# Patient Record
Sex: Male | Born: 1935 | Race: White | Hispanic: No | Marital: Married | Smoking: Never smoker
Health system: Southern US, Community
[De-identification: ages and names within clinical notes are randomized; demographics above are authoritative.]

## PROBLEM LIST (undated history)

## (undated) DIAGNOSIS — H409 Unspecified glaucoma: Secondary | ICD-10-CM

## (undated) DIAGNOSIS — I509 Heart failure, unspecified: Secondary | ICD-10-CM

## (undated) DIAGNOSIS — C4490 Unspecified malignant neoplasm of skin, unspecified: Secondary | ICD-10-CM

## (undated) DIAGNOSIS — M1990 Unspecified osteoarthritis, unspecified site: Secondary | ICD-10-CM

## (undated) DIAGNOSIS — Z5189 Encounter for other specified aftercare: Secondary | ICD-10-CM

## (undated) DIAGNOSIS — Z923 Personal history of irradiation: Secondary | ICD-10-CM

## (undated) DIAGNOSIS — I10 Essential (primary) hypertension: Secondary | ICD-10-CM

## (undated) DIAGNOSIS — C801 Malignant (primary) neoplasm, unspecified: Secondary | ICD-10-CM

## (undated) DIAGNOSIS — M109 Gout, unspecified: Secondary | ICD-10-CM

## (undated) DIAGNOSIS — J449 Chronic obstructive pulmonary disease, unspecified: Secondary | ICD-10-CM

## (undated) DIAGNOSIS — I4891 Unspecified atrial fibrillation: Secondary | ICD-10-CM

## (undated) HISTORY — DX: Unspecified glaucoma: H40.9

## (undated) HISTORY — DX: Malignant (primary) neoplasm, unspecified: C80.1

## (undated) HISTORY — DX: Unspecified malignant neoplasm of skin, unspecified: C44.90

## (undated) HISTORY — DX: Personal history of irradiation: Z92.3

## (undated) HISTORY — DX: Unspecified osteoarthritis, unspecified site: M19.90

## (undated) HISTORY — DX: Essential (primary) hypertension: I10

## (undated) HISTORY — DX: Encounter for other specified aftercare: Z51.89

## (undated) HISTORY — DX: Heart failure, unspecified: I50.9

## (undated) HISTORY — DX: Gout, unspecified: M10.9

---

## 1968-12-12 DIAGNOSIS — I10 Essential (primary) hypertension: Secondary | ICD-10-CM

## 1968-12-12 HISTORY — DX: Essential (primary) hypertension: I10

## 1993-12-12 DIAGNOSIS — C801 Malignant (primary) neoplasm, unspecified: Secondary | ICD-10-CM

## 1993-12-12 DIAGNOSIS — Z923 Personal history of irradiation: Secondary | ICD-10-CM

## 1993-12-12 HISTORY — DX: Malignant (primary) neoplasm, unspecified: C80.1

## 1993-12-12 HISTORY — DX: Personal history of irradiation: Z92.3

## 2000-12-12 HISTORY — PX: PROSTATE SURGERY: SHX751

## 2004-12-12 HISTORY — PX: COLONOSCOPY: SHX174

## 2005-04-25 ENCOUNTER — Ambulatory Visit: Payer: Self-pay | Admitting: Internal Medicine

## 2006-05-03 ENCOUNTER — Ambulatory Visit: Payer: Self-pay | Admitting: Specialist

## 2007-07-25 ENCOUNTER — Ambulatory Visit: Payer: Self-pay | Admitting: Ophthalmology

## 2007-07-26 ENCOUNTER — Ambulatory Visit: Payer: Self-pay | Admitting: Ophthalmology

## 2007-08-07 ENCOUNTER — Ambulatory Visit: Payer: Self-pay | Admitting: Family Medicine

## 2008-11-03 ENCOUNTER — Ambulatory Visit: Payer: Self-pay | Admitting: Family Medicine

## 2008-11-11 ENCOUNTER — Ambulatory Visit: Payer: Self-pay | Admitting: Family Medicine

## 2008-11-20 ENCOUNTER — Ambulatory Visit: Payer: Self-pay | Admitting: Family Medicine

## 2009-07-30 ENCOUNTER — Ambulatory Visit: Payer: Self-pay | Admitting: Family Medicine

## 2009-09-18 ENCOUNTER — Encounter: Admission: RE | Admit: 2009-09-18 | Discharge: 2009-09-18 | Payer: Self-pay | Admitting: Neurology

## 2010-03-11 ENCOUNTER — Ambulatory Visit: Payer: Self-pay | Admitting: Family Medicine

## 2010-12-12 DIAGNOSIS — M1990 Unspecified osteoarthritis, unspecified site: Secondary | ICD-10-CM

## 2010-12-12 HISTORY — DX: Unspecified osteoarthritis, unspecified site: M19.90

## 2010-12-21 ENCOUNTER — Ambulatory Visit: Payer: Self-pay | Admitting: Family Medicine

## 2011-03-09 ENCOUNTER — Ambulatory Visit: Payer: Self-pay | Admitting: Family Medicine

## 2011-03-12 ENCOUNTER — Ambulatory Visit: Payer: Self-pay | Admitting: Family Medicine

## 2011-03-14 ENCOUNTER — Ambulatory Visit: Payer: Self-pay | Admitting: Family Medicine

## 2011-06-04 ENCOUNTER — Observation Stay: Payer: Self-pay | Admitting: Specialist

## 2011-12-20 ENCOUNTER — Ambulatory Visit: Payer: Self-pay | Admitting: Family Medicine

## 2011-12-26 ENCOUNTER — Ambulatory Visit: Payer: Self-pay | Admitting: General Surgery

## 2011-12-26 ENCOUNTER — Ambulatory Visit: Payer: Self-pay | Admitting: Family Medicine

## 2011-12-28 ENCOUNTER — Ambulatory Visit: Payer: Self-pay | Admitting: General Surgery

## 2012-02-27 ENCOUNTER — Ambulatory Visit: Payer: Self-pay | Admitting: Family Medicine

## 2012-07-16 ENCOUNTER — Ambulatory Visit: Payer: Self-pay | Admitting: General Surgery

## 2012-09-04 ENCOUNTER — Ambulatory Visit: Payer: Self-pay | Admitting: Family Medicine

## 2012-12-03 ENCOUNTER — Ambulatory Visit: Payer: Self-pay | Admitting: Ophthalmology

## 2012-12-12 HISTORY — PX: CARDIAC DEFIBRILLATOR PLACEMENT: SHX171

## 2012-12-17 ENCOUNTER — Ambulatory Visit: Payer: Self-pay | Admitting: Family Medicine

## 2013-01-16 ENCOUNTER — Ambulatory Visit: Payer: Self-pay | Admitting: General Surgery

## 2013-05-30 ENCOUNTER — Ambulatory Visit: Payer: Self-pay | Admitting: Cardiology

## 2013-05-30 LAB — CBC WITH DIFFERENTIAL/PLATELET
Basophil #: 0 10*3/uL (ref 0.0–0.1)
Basophil %: 0.8 %
Eosinophil #: 0.5 10*3/uL (ref 0.0–0.7)
HCT: 39.7 % — ABNORMAL LOW (ref 40.0–52.0)
HGB: 13.8 g/dL (ref 13.0–18.0)
MCH: 34.1 pg — ABNORMAL HIGH (ref 26.0–34.0)
MCV: 98 fL (ref 80–100)
Monocyte #: 0.6 x10 3/mm (ref 0.2–1.0)
Monocyte %: 9.1 %
Neutrophil #: 3.7 10*3/uL (ref 1.4–6.5)
RDW: 14 % (ref 11.5–14.5)
WBC: 6.4 10*3/uL (ref 3.8–10.6)

## 2013-05-30 LAB — BASIC METABOLIC PANEL
Anion Gap: 4 — ABNORMAL LOW (ref 7–16)
Calcium, Total: 9.1 mg/dL (ref 8.5–10.1)
Chloride: 106 mmol/L (ref 98–107)
EGFR (African American): 59 — ABNORMAL LOW
Glucose: 70 mg/dL (ref 65–99)
Osmolality: 283 (ref 275–301)

## 2013-05-30 LAB — URINALYSIS, COMPLETE
Blood: NEGATIVE
Leukocyte Esterase: NEGATIVE
Nitrite: NEGATIVE
Ph: 5 (ref 4.5–8.0)
RBC,UR: <1 /HPF (ref 0–5)

## 2013-06-07 ENCOUNTER — Observation Stay: Payer: Self-pay | Admitting: Cardiology

## 2013-06-07 LAB — PROTIME-INR
INR: 1.1
Prothrombin Time: 14.5 secs (ref 11.5–14.7)

## 2013-06-08 LAB — BASIC METABOLIC PANEL
Anion Gap: 7 (ref 7–16)
BUN: 23 mg/dL — ABNORMAL HIGH (ref 7–18)
Calcium, Total: 8.8 mg/dL (ref 8.5–10.1)
Chloride: 107 mmol/L (ref 98–107)
Co2: 28 mmol/L (ref 21–32)
Creatinine: 1.38 mg/dL — ABNORMAL HIGH (ref 0.60–1.30)
EGFR (African American): 53 — ABNORMAL LOW
EGFR (Non-African Amer.): 46 — ABNORMAL LOW

## 2013-06-11 ENCOUNTER — Encounter: Payer: Self-pay | Admitting: *Deleted

## 2013-07-30 ENCOUNTER — Ambulatory Visit: Payer: Self-pay | Admitting: Family Medicine

## 2013-12-16 ENCOUNTER — Other Ambulatory Visit: Payer: Self-pay

## 2013-12-16 DIAGNOSIS — K8010 Calculus of gallbladder with chronic cholecystitis without obstruction: Secondary | ICD-10-CM

## 2014-01-17 ENCOUNTER — Ambulatory Visit: Payer: Self-pay | Admitting: General Surgery

## 2014-01-19 ENCOUNTER — Other Ambulatory Visit: Payer: Self-pay | Admitting: *Deleted

## 2014-01-19 DIAGNOSIS — K8010 Calculus of gallbladder with chronic cholecystitis without obstruction: Secondary | ICD-10-CM

## 2014-01-28 ENCOUNTER — Ambulatory Visit: Payer: Self-pay | Admitting: General Surgery

## 2014-02-19 ENCOUNTER — Ambulatory Visit (INDEPENDENT_AMBULATORY_CARE_PROVIDER_SITE_OTHER): Payer: Medicare HMO | Admitting: General Surgery

## 2014-02-19 ENCOUNTER — Encounter: Payer: Self-pay | Admitting: General Surgery

## 2014-02-19 VITALS — BP 104/64 | HR 58 | Resp 12 | Ht 71.5 in | Wt 195.0 lb

## 2014-02-19 DIAGNOSIS — K863 Pseudocyst of pancreas: Secondary | ICD-10-CM

## 2014-02-19 DIAGNOSIS — K8010 Calculus of gallbladder with chronic cholecystitis without obstruction: Secondary | ICD-10-CM

## 2014-02-19 DIAGNOSIS — R9389 Abnormal findings on diagnostic imaging of other specified body structures: Secondary | ICD-10-CM

## 2014-02-19 DIAGNOSIS — K862 Cyst of pancreas: Secondary | ICD-10-CM

## 2014-02-19 NOTE — Patient Instructions (Addendum)
Patient to return as needed. The patient is aware to call back for any questions or concerns. 

## 2014-02-19 NOTE — Progress Notes (Signed)
Patient ID: York Cerise, male   DOB: 02/21/1924, 80 y.o.   MRN: [id number]  Chief Complaint  Patient presents with  . Follow-up    gallbladder/pancrease     HPI ISLEY ZINNI is a 80 y.o. male here today for his one year follow up abdominal ultrasound which was done on 01/17/14. Patient states he is doing well.  The patient's wife died about 9 months ago. Although he is doing fairly well. He is now cooking for himself, and his weight is up 12 pounds over the past year.  Since his last visit he had a defibrillator/pacemaker placed.  A small pancreatic lesion had been identified incidentally on abdominal ultrasound, and this is a planned followup. He has been asymptomatic in regards to his known gallstones, at least one measuring just under 3 cm in diameter. As evidenced with his weight gain, he is not experiencing any dietary intolerance.  HPI  Past Medical History  Diagnosis Date  . Blood transfusion without reported diagnosis   . Congestive heart failure, unspecified   . Glaucoma   . Gout   . Hypertension 1970  . Arthritis 2012  . History of radiation therapy 1995    for prostate cancer  . Cancer 1995    prostate  . Skin cancer     Past Surgical History  Procedure Laterality Date  . Prostate surgery  2002  . Colonoscopy  2006  . Cardiac defibrillator placement  2014    History reviewed. No pertinent family history.  Social History History  Substance Use Topics  . Smoking status: Never Smoker   . Smokeless tobacco: Never Used  . Alcohol Use: Yes    Allergies  Allergen Reactions  . Codeine Nausea Only  . Penicillins Swelling    Current Outpatient Prescriptions  Medication Sig Dispense Refill  . apixaban (ELIQUIS) 5 MG TABS tablet Take 5 mg by mouth 2 (two) times daily.      . brimonidine (ALPHAGAN) 0.2 % ophthalmic solution Place 1 drop into both eyes 2 (two) times daily.      . budesonide-formoterol (SYMBICORT) 80-4.5 MCG/ACT inhaler Inhale 2 puffs  into the lungs 2 (two) times daily.      . Calcium Carbonate-Vitamin D (CALCIUM + D PO) Take 1 tablet by mouth daily.      Marland Kitchen doxazosin (CARDURA) 8 MG tablet Take 8 mg by mouth daily.      . furosemide (LASIX) 40 MG tablet Take 40 mg by mouth 2 (two) times daily.      Marland Kitchen lisinopril (PRINIVIL,ZESTRIL) 5 MG tablet Take 5 mg by mouth daily.      . metoprolol succinate (TOPROL-XL) 50 MG 24 hr tablet Take 50 mg by mouth 2 (two) times daily. Take with or immediately following a meal.      . Multiple Vitamin (MULTIVITAMIN) tablet Take 1 tablet by mouth daily.      . potassium chloride (MICRO-K) 10 MEQ CR capsule Take 10 mEq by mouth daily.      . simvastatin (ZOCOR) 40 MG tablet Take 40 mg by mouth daily.      . timolol (TIMOPTIC) 0.5 % ophthalmic solution Place 1 drop into both eyes 2 (two) times daily.       No current facility-administered medications for this visit.    Review of Systems Review of Systems  Constitutional: Negative.   Respiratory: Negative.   Cardiovascular: Negative.   Gastrointestinal: Negative.     Blood pressure 104/64, pulse 58, resp. rate 12,  height 5' 11.5" (1.816 m), weight 195 lb (88.451 kg).  Physical Exam Physical Exam  Constitutional: He is oriented to person, place, and time. He appears well-developed and well-nourished.  Neck: Neck supple. No thyromegaly present.  Cardiovascular: Normal rate, regular rhythm and normal heart sounds.   No murmur heard. Trace pitting edema.  Pulmonary/Chest: Effort normal and breath sounds normal.  Abdominal: Soft. Normal appearance and bowel sounds are normal. There is no hepatosplenomegaly. There is no tenderness. No hernia.  Lymphadenopathy:    He has no cervical adenopathy.  Neurological: He is alert and oriented to person, place, and time.  Skin: Skin is warm and dry.    Data Reviewed Abdominal ultrasound dated January 17, 2014 again noted gallstones measuring up to 2.9 cm in diameter. No evidence of cholecystitis.  The pancreas was obscured by gas. No focal abnormalities reported. In the left kidney a 2.5 cm cyst is identified and adjacent to this is a complex lesion measuring up to 3.3 cm. This was felt to correlate with the partially calcified lesion on past CT scans. With the enlargement in the left renal lesion from 2013-2015 additional imaging studies were recommended.   Assessment    Asymptomatic gallstones.  Previously identified small pancreatic lesion.  Possible enlarging left renal mass.     Plan    Indications for a triphasic CT to evaluate the left renal area as well as to better assess the pancreas (poorly visualized in both 2014 in 2015) was recommended. The patient declines. In part this may be due to the loss of his wife in the last year, or maybe his realization that with his advanced age intervention might be complicated. I did emphasize to him that a renal lesion might be managed by something as simple as a percutaneous process. He again at this time declines further diagnostic studies.  At this time we'll release him for routine followup. He was encouraged to call if he has any concerns or desires to readdress the additional imaging studies discussed above.         Robert Bellow 02/22/2014, 8:29 PM  Miguel Aschoff, M.D.

## 2014-02-22 DIAGNOSIS — K8010 Calculus of gallbladder with chronic cholecystitis without obstruction: Secondary | ICD-10-CM | POA: Insufficient documentation

## 2014-02-22 DIAGNOSIS — R9389 Abnormal findings on diagnostic imaging of other specified body structures: Secondary | ICD-10-CM | POA: Insufficient documentation

## 2014-02-22 DIAGNOSIS — K862 Cyst of pancreas: Secondary | ICD-10-CM | POA: Insufficient documentation

## 2014-04-03 ENCOUNTER — Ambulatory Visit: Payer: Self-pay | Admitting: Family Medicine

## 2014-04-21 ENCOUNTER — Ambulatory Visit: Payer: Self-pay | Admitting: Family Medicine

## 2014-04-29 ENCOUNTER — Ambulatory Visit: Payer: Self-pay | Admitting: Family Medicine

## 2014-06-07 ENCOUNTER — Ambulatory Visit: Payer: Self-pay | Admitting: Family Medicine

## 2014-06-07 LAB — COMPREHENSIVE METABOLIC PANEL
ANION GAP: 9 (ref 7–16)
AST: 39 U/L — AB (ref 15–37)
Albumin: 3.1 g/dL — ABNORMAL LOW (ref 3.4–5.0)
Alkaline Phosphatase: 178 U/L — ABNORMAL HIGH
BILIRUBIN TOTAL: 0.9 mg/dL (ref 0.2–1.0)
BUN: 28 mg/dL — ABNORMAL HIGH (ref 7–18)
CALCIUM: 9.2 mg/dL (ref 8.5–10.1)
CREATININE: 2.28 mg/dL — AB (ref 0.60–1.30)
Chloride: 105 mmol/L (ref 98–107)
Co2: 26 mmol/L (ref 21–32)
EGFR (African American): 29 — ABNORMAL LOW
EGFR (Non-African Amer.): 25 — ABNORMAL LOW
GLUCOSE: 109 mg/dL — AB (ref 65–99)
Osmolality: 285 (ref 275–301)
POTASSIUM: 3.9 mmol/L (ref 3.5–5.1)
SGPT (ALT): 26 U/L (ref 12–78)
Sodium: 140 mmol/L (ref 136–145)
TOTAL PROTEIN: 7.3 g/dL (ref 6.4–8.2)

## 2014-06-07 LAB — CBC WITH DIFFERENTIAL/PLATELET
BASOS ABS: 0.1 10*3/uL (ref 0.0–0.1)
BASOS PCT: 0.5 %
Eosinophil #: 0.1 10*3/uL (ref 0.0–0.7)
Eosinophil %: 1.1 %
HCT: 37.9 % — ABNORMAL LOW (ref 40.0–52.0)
HGB: 12.4 g/dL — ABNORMAL LOW (ref 13.0–18.0)
LYMPHS ABS: 1.3 10*3/uL (ref 1.0–3.6)
LYMPHS PCT: 11.6 %
MCH: 32.7 pg (ref 26.0–34.0)
MCHC: 32.7 g/dL (ref 32.0–36.0)
MCV: 100 fL (ref 80–100)
MONOS PCT: 7 %
Monocyte #: 0.8 x10 3/mm (ref 0.2–1.0)
NEUTROS ABS: 8.9 10*3/uL — AB (ref 1.4–6.5)
Neutrophil %: 79.8 %
Platelet: 192 10*3/uL (ref 150–440)
RBC: 3.79 10*6/uL — ABNORMAL LOW (ref 4.40–5.90)
RDW: 13.6 % (ref 11.5–14.5)
WBC: 11.2 10*3/uL — ABNORMAL HIGH (ref 3.8–10.6)

## 2014-06-25 ENCOUNTER — Emergency Department: Payer: Self-pay | Admitting: Emergency Medicine

## 2014-06-25 LAB — BASIC METABOLIC PANEL
Anion Gap: 6 — ABNORMAL LOW (ref 7–16)
BUN: 21 mg/dL — ABNORMAL HIGH (ref 7–18)
CHLORIDE: 104 mmol/L (ref 98–107)
Calcium, Total: 8.8 mg/dL (ref 8.5–10.1)
Co2: 30 mmol/L (ref 21–32)
Creatinine: 1.45 mg/dL — ABNORMAL HIGH (ref 0.60–1.30)
EGFR (African American): 49 — ABNORMAL LOW
EGFR (Non-African Amer.): 43 — ABNORMAL LOW
GLUCOSE: 132 mg/dL — AB (ref 65–99)
Osmolality: 284 (ref 275–301)
POTASSIUM: 4 mmol/L (ref 3.5–5.1)
Sodium: 140 mmol/L (ref 136–145)

## 2014-06-25 LAB — CBC
HCT: 34.8 % — AB (ref 40.0–52.0)
HGB: 11.4 g/dL — ABNORMAL LOW (ref 13.0–18.0)
MCH: 32.9 pg (ref 26.0–34.0)
MCHC: 32.9 g/dL (ref 32.0–36.0)
MCV: 100 fL (ref 80–100)
PLATELETS: 152 10*3/uL (ref 150–440)
RBC: 3.48 10*6/uL — ABNORMAL LOW (ref 4.40–5.90)
RDW: 13.8 % (ref 11.5–14.5)
WBC: 8.7 10*3/uL (ref 3.8–10.6)

## 2014-06-25 LAB — TROPONIN I: Troponin-I: <0.02 ng/mL

## 2014-06-25 LAB — D-DIMER(ARMC): D-Dimer: 1134 ng/ml

## 2014-07-30 ENCOUNTER — Ambulatory Visit: Payer: Self-pay | Admitting: Family Medicine

## 2014-09-16 ENCOUNTER — Encounter: Payer: Self-pay | Admitting: Family Medicine

## 2014-09-18 ENCOUNTER — Inpatient Hospital Stay: Payer: Self-pay | Admitting: Internal Medicine

## 2014-09-18 LAB — COMPREHENSIVE METABOLIC PANEL
ALK PHOS: 124 U/L — AB
Albumin: 2.7 g/dL — ABNORMAL LOW (ref 3.4–5.0)
Anion Gap: 9 (ref 7–16)
BILIRUBIN TOTAL: 2 mg/dL — AB (ref 0.2–1.0)
BUN: 30 mg/dL — AB (ref 7–18)
CO2: 24 mmol/L (ref 21–32)
CREATININE: 2.14 mg/dL — AB (ref 0.60–1.30)
Calcium, Total: 8.5 mg/dL (ref 8.5–10.1)
Chloride: 102 mmol/L (ref 98–107)
EGFR (African American): 38 — ABNORMAL LOW
EGFR (Non-African Amer.): 31 — ABNORMAL LOW
GLUCOSE: 132 mg/dL — AB (ref 65–99)
Osmolality: 278 (ref 275–301)
Potassium: 3.9 mmol/L (ref 3.5–5.1)
SGOT(AST): 30 U/L (ref 15–37)
SGPT (ALT): 21 U/L
SODIUM: 135 mmol/L — AB (ref 136–145)
Total Protein: 6.8 g/dL (ref 6.4–8.2)

## 2014-09-18 LAB — CK TOTAL AND CKMB (NOT AT ARMC)
CK, Total: 97 U/L
CK-MB: 1.3 ng/mL (ref 0.5–3.6)

## 2014-09-18 LAB — CBC
HCT: 32.8 % — ABNORMAL LOW (ref 40.0–52.0)
HGB: 11 g/dL — ABNORMAL LOW (ref 13.0–18.0)
MCH: 32.9 pg (ref 26.0–34.0)
MCHC: 33.4 g/dL (ref 32.0–36.0)
MCV: 98 fL (ref 80–100)
PLATELETS: 212 10*3/uL (ref 150–440)
RBC: 3.33 10*6/uL — ABNORMAL LOW (ref 4.40–5.90)
RDW: 13.9 % (ref 11.5–14.5)
WBC: 16 10*3/uL — AB (ref 3.8–10.6)

## 2014-09-18 LAB — TROPONIN I
TROPONIN-I: 0.04 ng/mL
Troponin-I: 0.04 ng/mL

## 2014-09-18 LAB — PROTIME-INR
INR: 1.9
PROTHROMBIN TIME: 21.3 s — AB (ref 11.5–14.7)

## 2014-09-18 LAB — BILIRUBIN, DIRECT: Bilirubin, Direct: 1 mg/dL — ABNORMAL HIGH (ref 0.00–0.20)

## 2014-09-18 LAB — CK-MB: CK-MB: 1.3 ng/mL (ref 0.5–3.6)

## 2014-09-18 LAB — PRO B NATRIURETIC PEPTIDE: B-Type Natriuretic Peptide: 31480 pg/mL — ABNORMAL HIGH (ref 0–450)

## 2014-09-19 LAB — COMPREHENSIVE METABOLIC PANEL
ALT: 22 U/L
ANION GAP: 11 (ref 7–16)
AST: 33 U/L (ref 15–37)
Albumin: 2.3 g/dL — ABNORMAL LOW (ref 3.4–5.0)
Alkaline Phosphatase: 117 U/L — ABNORMAL HIGH
BILIRUBIN TOTAL: 1.6 mg/dL — AB (ref 0.2–1.0)
BUN: 39 mg/dL — AB (ref 7–18)
CO2: 23 mmol/L (ref 21–32)
Calcium, Total: 8.4 mg/dL — ABNORMAL LOW (ref 8.5–10.1)
Chloride: 103 mmol/L (ref 98–107)
Creatinine: 2.27 mg/dL — ABNORMAL HIGH (ref 0.60–1.30)
EGFR (African American): 35 — ABNORMAL LOW
GFR CALC NON AF AMER: 29 — AB
Glucose: 165 mg/dL — ABNORMAL HIGH (ref 65–99)
Osmolality: 287 (ref 275–301)
Potassium: 4.1 mmol/L (ref 3.5–5.1)
Sodium: 137 mmol/L (ref 136–145)
TOTAL PROTEIN: 6.1 g/dL — AB (ref 6.4–8.2)

## 2014-09-19 LAB — CBC WITH DIFFERENTIAL/PLATELET
BASOS PCT: 0 %
Basophil #: 0 10*3/uL (ref 0.0–0.1)
EOS PCT: 0 %
Eosinophil #: 0 10*3/uL (ref 0.0–0.7)
HCT: 31.1 % — ABNORMAL LOW (ref 40.0–52.0)
HGB: 10.4 g/dL — AB (ref 13.0–18.0)
Lymphocyte #: 0.5 10*3/uL — ABNORMAL LOW (ref 1.0–3.6)
Lymphocyte %: 4.1 %
MCH: 32.7 pg (ref 26.0–34.0)
MCHC: 33.4 g/dL (ref 32.0–36.0)
MCV: 98 fL (ref 80–100)
Monocyte #: 0.5 x10 3/mm (ref 0.2–1.0)
Monocyte %: 4.1 %
Neutrophil #: 10.9 10*3/uL — ABNORMAL HIGH (ref 1.4–6.5)
Neutrophil %: 91.8 %
PLATELETS: 161 10*3/uL (ref 150–440)
RBC: 3.18 10*6/uL — AB (ref 4.40–5.90)
RDW: 14.1 % (ref 11.5–14.5)
WBC: 11.8 10*3/uL — ABNORMAL HIGH (ref 3.8–10.6)

## 2014-09-19 LAB — TROPONIN I: Troponin-I: <0.02 ng/mL

## 2014-09-19 LAB — CK-MB
CK-MB: 1.8 ng/mL (ref 0.5–3.6)
CK-MB: 2.1 ng/mL (ref 0.5–3.6)

## 2014-09-20 LAB — CBC WITH DIFFERENTIAL/PLATELET
Basophil #: 0 10*3/uL (ref 0.0–0.1)
Basophil %: 0 %
Eosinophil #: 0 10*3/uL (ref 0.0–0.7)
Eosinophil %: 0 %
HCT: 30.3 % — ABNORMAL LOW (ref 40.0–52.0)
HGB: 10.1 g/dL — ABNORMAL LOW (ref 13.0–18.0)
LYMPHS PCT: 2.8 %
Lymphocyte #: 0.4 10*3/uL — ABNORMAL LOW (ref 1.0–3.6)
MCH: 32.2 pg (ref 26.0–34.0)
MCHC: 33.4 g/dL (ref 32.0–36.0)
MCV: 96 fL (ref 80–100)
Monocyte #: 0.6 x10 3/mm (ref 0.2–1.0)
Monocyte %: 4 %
NEUTROS PCT: 93.2 %
Neutrophil #: 13.8 10*3/uL — ABNORMAL HIGH (ref 1.4–6.5)
Platelet: 209 10*3/uL (ref 150–440)
RBC: 3.14 10*6/uL — ABNORMAL LOW (ref 4.40–5.90)
RDW: 14.3 % (ref 11.5–14.5)
WBC: 14.8 10*3/uL — AB (ref 3.8–10.6)

## 2014-09-20 LAB — BASIC METABOLIC PANEL
ANION GAP: 13 (ref 7–16)
BUN: 59 mg/dL — ABNORMAL HIGH (ref 7–18)
CALCIUM: 8.3 mg/dL — AB (ref 8.5–10.1)
CO2: 23 mmol/L (ref 21–32)
Chloride: 103 mmol/L (ref 98–107)
Creatinine: 2.64 mg/dL — ABNORMAL HIGH (ref 0.60–1.30)
EGFR (African American): 30 — ABNORMAL LOW
GFR CALC NON AF AMER: 24 — AB
Glucose: 141 mg/dL — ABNORMAL HIGH (ref 65–99)
Osmolality: 296 (ref 275–301)
Potassium: 3.8 mmol/L (ref 3.5–5.1)
SODIUM: 139 mmol/L (ref 136–145)

## 2014-09-21 LAB — CBC WITH DIFFERENTIAL/PLATELET
BASOS ABS: 0 10*3/uL (ref 0.0–0.1)
Basophil %: 0.1 %
EOS PCT: 0 %
Eosinophil #: 0 10*3/uL (ref 0.0–0.7)
HCT: 30.2 % — ABNORMAL LOW (ref 40.0–52.0)
HGB: 10.2 g/dL — AB (ref 13.0–18.0)
Lymphocyte #: 0.5 10*3/uL — ABNORMAL LOW (ref 1.0–3.6)
Lymphocyte %: 3.5 %
MCH: 32.7 pg (ref 26.0–34.0)
MCHC: 33.8 g/dL (ref 32.0–36.0)
MCV: 97 fL (ref 80–100)
MONO ABS: 0.4 x10 3/mm (ref 0.2–1.0)
Monocyte %: 3.2 %
NEUTROS PCT: 93.2 %
Neutrophil #: 12.7 10*3/uL — ABNORMAL HIGH (ref 1.4–6.5)
Platelet: 251 10*3/uL (ref 150–440)
RBC: 3.12 10*6/uL — AB (ref 4.40–5.90)
RDW: 14.6 % — AB (ref 11.5–14.5)
WBC: 13.6 10*3/uL — ABNORMAL HIGH (ref 3.8–10.6)

## 2014-09-21 LAB — BASIC METABOLIC PANEL
ANION GAP: 10 (ref 7–16)
BUN: 82 mg/dL — AB (ref 7–18)
CHLORIDE: 102 mmol/L (ref 98–107)
CREATININE: 2.92 mg/dL — AB (ref 0.60–1.30)
Calcium, Total: 8.2 mg/dL — ABNORMAL LOW (ref 8.5–10.1)
Co2: 25 mmol/L (ref 21–32)
EGFR (African American): 26 — ABNORMAL LOW
GFR CALC NON AF AMER: 22 — AB
Glucose: 145 mg/dL — ABNORMAL HIGH (ref 65–99)
Osmolality: 301 (ref 275–301)
Potassium: 4.1 mmol/L (ref 3.5–5.1)
SODIUM: 137 mmol/L (ref 136–145)

## 2014-09-23 LAB — CULTURE, BLOOD (SINGLE)

## 2014-11-10 ENCOUNTER — Encounter: Payer: Self-pay | Admitting: Family Medicine

## 2014-11-11 ENCOUNTER — Encounter: Payer: Self-pay | Admitting: Family Medicine

## 2014-12-12 ENCOUNTER — Encounter: Payer: Self-pay | Admitting: Family Medicine

## 2015-01-28 ENCOUNTER — Encounter: Payer: Self-pay | Admitting: Family Medicine

## 2015-02-06 IMAGING — CR DG CHEST 1V PORT
1 series · 1 of 1 positions shown · non-contrast
Comparison: none

REASON FOR EXAM: Pacemaker
COMMENTS:

[ap]
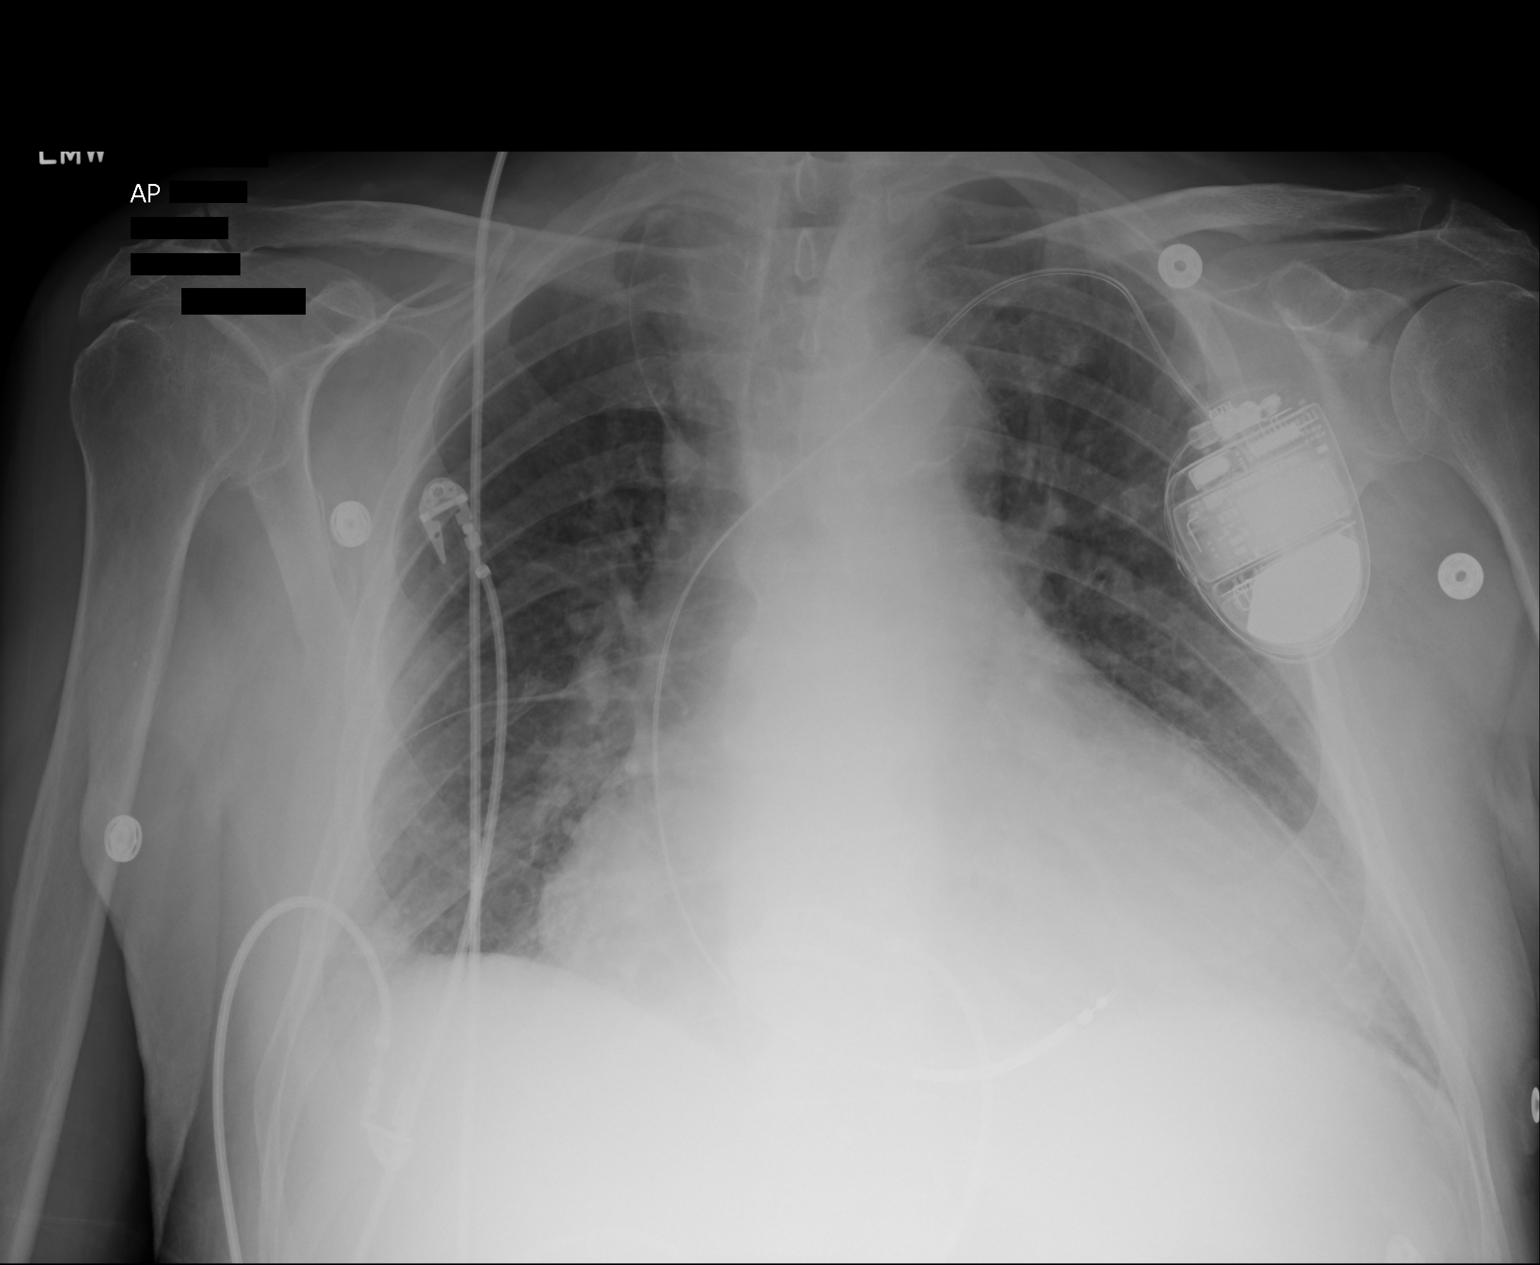

[1 of 1 positions shown; findings below may reference images not displayed]

PROCEDURE:     DXR - DXR PORTABLE CHEST SINGLE VIEW  - June 08, 2013  [DATE]

RESULT:     Comparison is made to the study May 30, 2013.

The cardiac silhouette remains enlarged. The pulmonary vascularity is less
distinct today. The interstitial markings are slightly more conspicuous. A
permanent pacemaker has been placed. There is no evidence of a pneumothorax
or pleural effusion. There is an azygos lobe type anatomy of the right lung.
IMPRESSION: The findings suggest low-grade CHF. A portion of this is
related to the portable technique. No post procedure complication is
demonstrated.

[REDACTED]

## 2015-02-10 ENCOUNTER — Encounter: Payer: Self-pay | Admitting: Family Medicine

## 2015-02-23 ENCOUNTER — Ambulatory Visit: Payer: Self-pay | Admitting: Family Medicine

## 2015-03-17 ENCOUNTER — Ambulatory Visit
Admit: 2015-03-17 | Disposition: A | Discharge status: Home / Self Care | Payer: Self-pay | Attending: Family Medicine | Admitting: Family Medicine

## 2015-04-03 NOTE — Op Note (Signed)
PATIENT NAME:  [name], [name] MR#:  [id number] DATE OF BIRTH:  September 04, 1924  DATE OF PROCEDURE:  06/07/2013  TITLE OF PROCEDURE: Implantation of a single chamber implantable cardioverter defibrillator.   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 Device is indicated for primary prevention of sudden cardiac arrest.   PREPROCEDURE DIAGNOSES:  1.  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   POSTPROCEDURE DIAGNOSES: 1.  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   DETAILS OF PROCEDURE: The patient was brought to the operating room in a fasting, nonsedated state. The patient was prepped and draped in the usual sterile manner. The area of the left infraclavicular fossa was scrubbed and prepped with ChloraPrep x 2. Local anesthesia was administered after the appropriate resuscitative equipment was attached to the patient. Informed consent was obtained and placed in the patient's permanent medical record. In the left infraclavicular fossa, a 2 cm incision was made, and with the combination of electrocautery and blunt dissection, the ICD pocket was fashioned in the usual manner. Access to the central circulation was done with fluoroscopic guidance and using the modified Seldinger technique. A guidewire was passed easily through the axillary vein to the area of the inferior vena cava. Over the guidewire, a dilator and sheath was placed. The dilator and guidewire were subsequently removed, and the RV lead was advanced to the right ventricular outflow tract. It was subsequently withdrawn to the mid interventricular septum. Sensed R waves there were 10.4 mV with a slew rate of 1.6 V/sec, impedance 635 ohms, and a threshold of 0.9 V at 0.5 ms. The lead was secured to the prepectoral fascia using an 0 silk suture and adequate redundancy was confirmed using fluoroscopic guidance. Total fluoroscopy time for the case was 5 minutes and 9  seconds.   The pocket was then irrigated with copious amounts of gentamicin solution, and adequate hemostasis was confirmed. The lead was then connected to the ICD generator and was placed in the pocket, and the pocket was closed with running layers of 2-0 and 3-0 Vicryl, and the subcuticular layer with 4-0 Vicryl.   No complications were noted. Minimal blood loss noted.   SUMMARY OF IMPLANTED HARDWARE: The patient received a Medtronic single-chamber ICD, model Evera S VR [id number], serial number [id number], implanted on 06/07/2013. The RV lead was a Medtronic [id number] Sprint Quattro, serial number [id number], implanted on 06/07/2013.   FINAL PROGRAM PARAMETERS: Mode VVI 40. The patient was in chronic atrial fibrillation. Output 3.5 V at 0.4 ms. VF zone was set to detect at rates greater than 200 beats per minute, 18 out of 24. The VT monitor zone was at 162 with detection at 32.     ____________________________ [name]. Marcello Moores, MD klt:dmm D: 06/07/2013 16:57:20 ET T: 06/07/2013 22:46:31 ET JOB#: [id number]  cc: Lennette Bihari L. Marcello Moores, MD, <Dictator> Marzetta Board MD ELECTRONICALLY SIGNED 06/16/2013 19:22

## 2015-04-04 NOTE — Consult Note (Signed)
Present Illness Patient is an 81 year old male with history of atrial fibrillation which is chronic.  He also has a cardiomyopathy with an AICD in place.  He was recently admitted with complaints of shortness of breath and weakness.  He was noted to have atrial fibrillation with a rapid ventricular response.  He was placed on an amiodarone drip with improvement in his heart rate.  Chest x-ray revealed evidence of pneumonia.  It is of note that the patient had an admission with similar complaints approximately 1-2 months ago.  He complained on this admission of weakness and fatigue.  He was noted to have a rapid heart rate similar to his previous admission.  He complains of shortness of breath.  His heart rate is improved with IV Cardizem.  His ejection fraction is 10-15%.  He is currently ruled out for a myocardial infarction.  As an outpatient he is being treated with apixaban 5 mg twice daily for anticoagulation.  His rate is controlled with metoprolol.  His serum creatinine has increased since his admission.  His GFR is approximately 29.  his Eliquis has been decreased to 0.5 mg twice daily. chest x-ray revealed moderate pulmonary edema as well as evidence of probable pneumonia.   Physical Exam:  GEN no acute distress   HEENT hearing intact to voice   NECK supple   RESP normal resp effort  rhonchi   CARD Irregular rate and rhythm  Tachycardic   ABD denies tenderness   LYMPH negative neck, negative axillae   EXTR negative cyanosis/clubbing   SKIN normal to palpation, No rashes   NEURO cranial nerves intact, motor/sensory function intact   PSYCH alert, poor insight   Review of Systems:  Subjective/Chief Complaint Weakness fatigue and shortness of breath   General: Fatigue  Weakness   Skin: No Complaints   ENT: No Complaints   Eyes: No Complaints   Neck: No Complaints   Respiratory: Short of breath   Cardiovascular: Tightness  Palpitations  Dyspnea   Gastrointestinal: No  Complaints   Genitourinary: No Complaints   Vascular: No Complaints   Musculoskeletal: No Complaints   Neurologic: No Complaints   Hematologic: No Complaints   Endocrine: No Complaints   Psychiatric: No Complaints   Review of Systems: All other systems were reviewed and found to be negative   Medications/Allergies Reviewed Medications/Allergies reviewed   Family & Social History:  Family and Social History:  Family History Non-Contributory   EKG:  Abnormal NSSTTW changes   Interpretation Atrial fibrillation with rapid ventricular response.  There were no obvious ischemic changes.    Penicillin: Swelling  Codeine: N/V/Diarrhea  Tetanus Toxoid: Unknown   Impression 81 year old male with history of chronic atrial fibrillation and dilated cardiomyopathy with ejection fraction of 10-15% with an AICD in place.  He was admitted with shortness of breath.  Chest x-ray suggested probable pneumonia as well as volume overload.  This renal function was also reduced somewhat with a GFR of 29.  This is down from the mid 108s.  He has had improvement in his rapid atrial fibrillation with IV amiodarone.  Will convert from IV amiodarone to p.o. amiodarone at 400 mg daily and follow up.  Will continue to carefully diurese following is renal function.  Would consider nephrology evaluation to assist with management of his worsening renal insufficiency.  Patient is not appear ischemic at present.  Would carefully diurese in rate control with his amiodarone p.o..  Will need to carefully follow renal function with his  apixaban.  Would agree would dosing at 2.5 mg twice daily for now.  If his GFR follows much more, will need to consider discontinuing apixaban and treating with warfarin for anticoagulation.   Plan 1. Will discontinue IV amiodarone and place on 400 mg of p.o. amiodarone daily 2. Carefully follow renal function while diuresing 3. Low-sodium diet 4. Daily weights 5. further  recommendations pending course   Electronic Signatures: Teodoro Spray (MD)  (Signed 09-Oct-15 13:23)  Authored: General Aspect/Present Illness, History and Physical Exam, Review of System, Family & Social History, EKG , Allergies, Impression/Plan   Last Updated: 09-Oct-15 13:23 by Teodoro Spray (MD)

## 2015-04-04 NOTE — H&P (Signed)
PATIENT NAME:  Philip Morris, Philip Morris MR#:  [id number] DATE OF BIRTH:  [birth year]  DATE OF ADMISSION:  09/18/2014  PRIMARY CARE PHYSICIAN:  Dr. Rosanna Randy.    CARDIOLOGIST:  Dr. Saralyn Pilar.    HISTORY OF PRESENT ILLNESS: The patient is an 81 year old Caucasian male with past medical history significant for history of chronic systolic CHF with idiopathic dilated cardiomyopathy with ejection fraction of 10-15%, who presents to the hospital with complaints of not feeling well. Apparently the patient has been weak and fatigued for the past few days. He has been also noticing chills and cough with no significant sputum production. He has been also complaining of left-sided chest discomfort, especially whenever he walks around or moves around. He presented to his primary care physician's office where he was noted to be hypotensive with systolic blood pressure of 88/50. His heart rate was in 90s at that point and he was transferred to the Emergency Room. In the Emergency Room his heart rate was found to be 150. His blood pressure was 798 systolic. He was given 5 mg of IV Lopressor, after which his blood pressure dropped down to around 110s and his heart rate dropped down to around 100. He admitted to feeling palpitations on arrival to the hospital and some discomfort in his left lower chest as mentioned above. He however has been complaining of shortness of breath and feeling very sleepy to me.  On further evaluation he was noted to have likely left lower lobe pneumonia, also acute on chronic renal failure with creatinine level of 2.14 on admission, his baseline creatinine is around 1.15 June 2014.   PAST MEDICAL HISTORY: Significant for history of chronic systolic CHF, ejection fraction of 10-15% due to idiopathic dilated cardiomyopathy, some valvular disease, history of hypertension, hyperlipidemia, BPH, renal insufficiency, creatinine level of 1.15 June 2014, history of supraventricular contractions and bradycardia in the  past, history of AICD placement in June 2014, history of transient ischemic attack, history of Holter monitor in December 2011 which revealed PVCs, occasional couplets as well as triplets and PACs.   PAST SURGICAL HISTORY: As above.   ALLERGIES: CODEINE AS WELL AS PENICILLIN.   MEDICATIONS: According to medical records the patient is on brimonidine ophthalmic solution 0.2% 1 drop to each eye twice daily, doxazosin 8 mg once at bedtime, Eliquis 5 mg twice daily, furosemide 40 mg p.o. daily, metoprolol succinate 25 mg extended release once daily, multivitamins once daily, potassium chloride 10 mEq once daily, simvastatin 40 mg p.o. daily, Symbicort 80/4.5 two puffs twice daily, timolol ophthalmic solution 0.5% to each eye 1 drop twice daily, Uloric 40 mg p.o. once daily.   FAMILY HISTORY: The patient's brother in good health, 66 years old. Mother deceased, died from stroke with history of coronary artery disease, the age of death 60.  Number of siblings, 1 brother. Father deceased, died from MI as well as had stroke, age of the death 69,  history of heavy tobacco abuse.   SOCIAL HISTORY: The patient drinks alcohol daily. Lives with his spouse. No drug abuse. He is married.  College graduate, BS degree.  Former pipe smoker. Has 2 children, son is present during my interview.    REVIEW OF SYSTEMS:    CONSTITUTIONAL:  Positive for feeling chills yesterday, fatigue and weakness for the past 3-5 days. Pains in left lower chest increasing with movement, also cough as well as wheezes and dyspnea, shortness of breath, chest pains, also palpitations, sleepiness, fatigue. Denies any high fevers, weight loss or  gain.    EYES: Denies any blurry vision, double vision, glaucoma or cataracts.  EARS, NOSE, THROAT: Denies any tinnitus, allergies, epistaxis, sinus pain, dentures, difficulty swallowing.  RESPIRATORY: Denies hemoptysis.  CARDIOVASCULAR: Denies orthopnea, edema, arrhythmias, or syncope.   GASTROINTESTINAL: Denies nausea, vomiting, diarrhea, constipation. Admits to poor p.o. intake.  GENITOURINARY: Denies dysuria, hematuria, frequency, incontinence. The patient admits of having difficulty emptying bladder. Denies any other significant abnormalities.  ENDOCRINE:  Denies any polydipsia, nocturia, thyroid problems, heat or cold intolerance, excessive thirst, arthralgias, any anemia, easy bruising, bleeding,  SKIN: Denies any acne, rash, lesions, or change in moles.  MUSCULOSKELETAL: Denies arthritis, cramps, swelling.  NEUROLOGIC:  Denies numbness, epilepsy or tremor.  PSYCHIATRIC: Denies anxiety, insomnia, or depression.   PHYSICAL EXAMINATION:  VITAL SIGNS: On arrival to the hospital temperature was 98.6, pulse was 150, respiration was 28, blood pressure 180/138, saturation was 94% on room air.  GENERAL: This is a well-developed, well-nourished Caucasian male not in any distress, lying on the stretcher.  HEENT: His pupils are equal and reactive to light. Extraocular movements intact.  Is somewhat somnolent, but able to open his eyes and converse.  NECK: No masses. Supple, nontender. Thyroid is not enlarged. No adenopathy. No JVD or carotid bruits bilaterally. Full range of motion.  LUNGS: Crackles on the left side posteriorly in the lower part of the lungs, some dullness to percussion, a few rhonchi were heard as well as diminished breath sounds in that area, a few wheezes were heard as well as labored inspirations, as well as increased effort to breathe and tachypnea, in mild respiratory distress.  CARDIOVASCULAR: S1, S2 appreciated. Rhythm is irregularly irregular, tachycardic. No murmurs. Chest is nontender to palpation.   EXTREMITIES:  1 + pedal pulses. Significant 2-3 + lower extremity edema. No calf tenderness or cyanosis was noted.  ABDOMEN: Soft, nontender. Bowel sounds are present. No hepatosplenomegaly or masses were noted.  RECTAL: Deferred.  MUSCLE STRENGTH: Able to move  all extremities well with significant somnolence and he is not able to cooperate with exam. No cyanosis.  No degenerative joint disease or kyphosis. The patient however is not able to sit up, he is so weak. He is able to turn to one side only while he is lying on the right side.   SKIN:  Did not reveal any rashes, lesions, erythema, nodularity. Significant induration was noted in lower extremities. Skin was warm and dry to palpation.  LYMPHATIC: No adenopathy in the cervical region.  NEUROLOGICAL: Cranial nerves grossly intact. Sensory is intact. No dysphasia or aphasia. The patient is somnolent, although he is waking up. He is oriented to person and place, poorly cooperative. Memory is somewhat impaired, but no significant confusion, agitation, or depression were noted.   LABORATORY DATA: BMP showed glucose of 132. Beta-type natriuretic peptide was 31,480. BUN and creatinine were 30 and 2.14 and the creatinine was 1.45 in July 2015, sodium 135, otherwise BMP was unremarkable. The patient's liver enzymes, albumin level of 2.7, total bilirubin of 2.0, alkaline phosphatase 124, otherwise liver enzymes were unremarkable. Cardiac enzymes, first set negative. White blood cell count is elevated to 16.0, hemoglobin was 11.0, platelet count was 212,000. Coagulation panel showed a pro time of 21.3, INR was 1.9.  EKG, 2 EKGs were performed in the Emergency Room, showed atrial fibrillation, rate of 145, left axis deviation, left bundle branch block, and nonspecific ST-T changes, T depressions in V6 were noted.   RADIOLOGIC STUDIES: Chest x-ray portable single view 09/18/2014 showed  probable mild CHF with atelectasis versus consolidation in left lower lobe.   ASSESSMENT AND PLAN:  1.  Atrial fibrillation, rapid ventricular response. Admit the patient to medical floor. Start him on amiodarone IV drip, follow his heart rate and add Coreg if he is able to tolerate.  2.  Chest pain, likely due to atrial fibrillation  versus pneumonia. Follow cardiac enzymes as mentioned above, initiate him on Coreg.   3.  Pneumonia. We will start the patient on doxycycline orally, doxycycline chosen due to long QT interval and we will get sputum cultures.  4.  Systemic inflammatory response syndrome due to pneumonia. We will get blood cultures, sputum cultures, as well as urine cultures.   5.  Acute on chronic renal failure. We will follow with improved cardiac function for now, the patient will be receiving diuretics and we will follow with diuretics.  6.  Acute on chronic systolic congestive heart failure. We will continue the patient on Lasix, will advance it to twice daily, and we will follow creatinine level.  7.  Chronic obstructive pulmonary disease exacerbation.  We will initiate the patient on Solu-Medrol, antibiotics, inhalations, and DuoNebs.   TIME SPENT: 1 hour, 15 minutes.     ____________________________ Theodoro Grist, MD rv:bu D: 09/18/2014 19:50:07 ET T: 09/18/2014 20:11:42 ET JOB#: [id number]  cc: Theodoro Grist, MD, <Dictator> Richard L. Rosanna Randy, MD Theodoro Grist MD ELECTRONICALLY SIGNED 10/10/2014 12:20

## 2015-04-04 NOTE — Discharge Summary (Signed)
PATIENT NAME:  Philip Morris, Philip Morris MR#:  [id number] DATE OF BIRTH:  1924-03-05  DATE OF ADMISSION:  09/18/2014 DATE OF DISCHARGE:  09/21/2014  ADMITTING PHYSICIAN: Theodoro Grist, MD   DISCHARGING PHYSICIAN: Gladstone Lighter, MD   PRIMARY CARE PHYSICIAN: Richard L. Rosanna Randy, MD   PRIMARY CARDIOLOGIST: Isaias Cowman, MD   [place]: Cardiology consultation by Javier Docker. Ubaldo Glassing, MD.  DISCHARGE DIAGNOSES:  1.  Acute on chronic systolic congestive heart failure exacerbation, ejection fraction of 15%.  2.  Atrial fibrillation with rapid ventricular response.  3.  Pneumonia.  4.  Acute renal failure.  5.  Chronic kidney disease stage 3 at baseline, likely cause could have been hypertension.  6.  Chronic obstructive pulmonary disease with mild exacerbation.  DISCHARGE HOME MEDICATIONS: 1.  Multivitamin 1 tablet p.o. daily.  2.  Potassium chloride 10 mEq p.o. daily.  3.  Timolol ophthalmic solution 0.5% 1 drop each twice a day.  4.  Brimonidine ophthalmic solution 0.2% 1 drop each twice a day.  5.  Symbicort 80/4.5 mcg inhalation 2 puffs twice a day.  6.  Uloric 40 mg p.o. daily.  7.  Doxazosin 8 mg p.o. daily.  8.  Simvastatin 40 mg p.o. daily.  9.  Lasix 40 mg p.o. daily.  10.  Apixaban 3.5 mg p.o. b.i.d.  11.  Amiodarone 400 mg p.o. daily.  12.  Coreg 3.125 mg p.o. b.i.d.  13.  Doxycycline 100 mg p.o. b.i.d. for 4 or 5 days.  5.  Prednisone taper over 5 days.   DISCHARGE DIET: Low-sodium, low-fat diet.   DISCHARGE ACTIVITY: As tolerated.    FOLLOWUP INSTRUCTIONS:  1.  Home health physical therapy and nursing.  2.  Follow up with Dr. Saralyn Pilar in 1 week.  3.  PCP follow up in 1 week.  4.  Basic metabolic panel check next week, either with PCP or cardiologist.  5.  Outpatient nephrology followup recommended. Please check PCP for referral.    LABORATORY AND IMAGING STUDIES PRIOR TO DISCHARGE: WBC 13.6, hemoglobin 10.2, hematocrit 30.2, platelet count is  251,000.   Sodium 137, potassium 4.1, chloride 102, bicarbonate 25, BUN 82, creatinine 2.9, glucose 145, and calcium of 8.2. Troponins have remained negative. Chest x-ray on the 9th showing improvement in pulmonary interstitial with resolving CHF. Retrocardiac region remains dense with pleural effusion or atelectasis.   BRIEF HOSPITAL COURSE: Mr. Lindstrom is an 81 year old Caucasian male with past medical history significant for systolic CHF with EF of 33%, chronic renal failure. Baseline creatinine around 1.5 with CKD stage 3, history of atrial fibrillation, BPH, presents to the hospital secondary to not feeling well and noted to have atrial fibrillation with RVR and also CHF exacerbation.  1.  Acute on chronic CHF exacerbation, systolic dysfunction. Echocardiogram was done by Dr. Saralyn Pilar as an outpatient. EF is reportedly 15%. The patient was well diuresed, however, Lasix dose changed back to 40 mg daily at the time of discharge due to worsening renal failure at this point. He is being diuresed well. His saturations are improved. He is able to ambulate without any dyspnea at this time. He will follow up with Dr. Saralyn Pilar as an outpatient. He is on statin and Coreg. He is not any ACE inhibitor or ARBs or aldosterone secondary to his worsening renal failure and also low normal blood pressure. He is also on statin.  2.  Atrial fibrillation with RVR, rate well controlled now. He was on amiodarone drip, now changed over to 400  mg p.o. daily; that will be changed to 200 mg p.o. daily in the next couple of weeks as an outpatient. He is also on Coreg. He is on anticoagulation with Eliquis, dose has been adjusted to 2.5 mg b.i.d. because of his worsening renal function at this time.   3.  COPD with mild exacerbation with bronchitis, pneumonia. The patient responded well to steroids, changed over to prednisone taper and also on doxycycline. He is on inhalers, which will be continued.  4.  Acute on chronic renal  failure. Creatinine seems to be around 1.5 to 2 at baseline with GFR greater than 30; however, GFR slowly decreasing to 22 at this time. Lasix dose has been decreased, encouraged to drink more fluids and follow up BMP as an outpatient and will also need nephrology follow up as an outpatient at this point.   His course has been otherwise uneventful in the hospital. He worked with physical therapy and they recommended home health at this time.   DISCHARGE CONDITION: Stable.   DISCHARGE DISPOSITION: Home with home health.   TIME SPENT ON DISCHARGE: Forty minutes.    ____________________________ Gladstone Lighter, MD rk:TT D: 09/21/2014 10:10:18 ET T: 09/21/2014 15:02:16 ET JOB#: [id number]  cc: Gladstone Lighter, MD, <Dictator> Richard L. Rosanna Randy, MD Isaias Cowman, MD Gladstone Lighter MD ELECTRONICALLY SIGNED 09/24/2014 18:04

## 2015-04-08 ENCOUNTER — Ambulatory Visit
Admit: 2015-04-08 | Disposition: A | Discharge status: Home / Self Care | Payer: Self-pay | Attending: Family Medicine | Admitting: Family Medicine

## 2015-04-20 ENCOUNTER — Inpatient Hospital Stay
Admission: EM | Admit: 2015-04-20 | Discharge: 2015-04-24 | DRG: 872 | Attending: Internal Medicine | Admitting: Internal Medicine

## 2015-04-20 ENCOUNTER — Emergency Department

## 2015-04-20 ENCOUNTER — Encounter: Payer: Self-pay | Admitting: Emergency Medicine

## 2015-04-20 DIAGNOSIS — J449 Chronic obstructive pulmonary disease, unspecified: Secondary | ICD-10-CM | POA: Diagnosis present

## 2015-04-20 DIAGNOSIS — Z95810 Presence of automatic (implantable) cardiac defibrillator: Secondary | ICD-10-CM

## 2015-04-20 DIAGNOSIS — I129 Hypertensive chronic kidney disease with stage 1 through stage 4 chronic kidney disease, or unspecified chronic kidney disease: Secondary | ICD-10-CM | POA: Diagnosis present

## 2015-04-20 DIAGNOSIS — Z9181 History of falling: Secondary | ICD-10-CM | POA: Diagnosis not present

## 2015-04-20 DIAGNOSIS — Z85828 Personal history of other malignant neoplasm of skin: Secondary | ICD-10-CM | POA: Diagnosis not present

## 2015-04-20 DIAGNOSIS — I472 Ventricular tachycardia: Secondary | ICD-10-CM | POA: Diagnosis not present

## 2015-04-20 DIAGNOSIS — W19XXXA Unspecified fall, initial encounter: Secondary | ICD-10-CM | POA: Diagnosis present

## 2015-04-20 DIAGNOSIS — N179 Acute kidney failure, unspecified: Secondary | ICD-10-CM | POA: Diagnosis present

## 2015-04-20 DIAGNOSIS — I482 Chronic atrial fibrillation: Secondary | ICD-10-CM | POA: Diagnosis present

## 2015-04-20 DIAGNOSIS — Z880 Allergy status to penicillin: Secondary | ICD-10-CM | POA: Diagnosis not present

## 2015-04-20 DIAGNOSIS — I5022 Chronic systolic (congestive) heart failure: Secondary | ICD-10-CM | POA: Diagnosis present

## 2015-04-20 DIAGNOSIS — N189 Chronic kidney disease, unspecified: Secondary | ICD-10-CM | POA: Diagnosis not present

## 2015-04-20 DIAGNOSIS — I420 Dilated cardiomyopathy: Secondary | ICD-10-CM | POA: Diagnosis present

## 2015-04-20 DIAGNOSIS — Z66 Do not resuscitate: Secondary | ICD-10-CM | POA: Diagnosis present

## 2015-04-20 DIAGNOSIS — I4891 Unspecified atrial fibrillation: Secondary | ICD-10-CM | POA: Diagnosis present

## 2015-04-20 DIAGNOSIS — Z8546 Personal history of malignant neoplasm of prostate: Secondary | ICD-10-CM

## 2015-04-20 DIAGNOSIS — E785 Hyperlipidemia, unspecified: Secondary | ICD-10-CM | POA: Diagnosis present

## 2015-04-20 DIAGNOSIS — Z7952 Long term (current) use of systemic steroids: Secondary | ICD-10-CM | POA: Diagnosis not present

## 2015-04-20 DIAGNOSIS — R29898 Other symptoms and signs involving the musculoskeletal system: Secondary | ICD-10-CM | POA: Diagnosis present

## 2015-04-20 DIAGNOSIS — Z515 Encounter for palliative care: Secondary | ICD-10-CM | POA: Diagnosis not present

## 2015-04-20 DIAGNOSIS — N184 Chronic kidney disease, stage 4 (severe): Secondary | ICD-10-CM | POA: Diagnosis present

## 2015-04-20 DIAGNOSIS — I248 Other forms of acute ischemic heart disease: Secondary | ICD-10-CM | POA: Diagnosis present

## 2015-04-20 DIAGNOSIS — H409 Unspecified glaucoma: Secondary | ICD-10-CM | POA: Diagnosis present

## 2015-04-20 DIAGNOSIS — B954 Other streptococcus as the cause of diseases classified elsewhere: Secondary | ICD-10-CM | POA: Diagnosis present

## 2015-04-20 DIAGNOSIS — M109 Gout, unspecified: Secondary | ICD-10-CM | POA: Diagnosis present

## 2015-04-20 DIAGNOSIS — E875 Hyperkalemia: Secondary | ICD-10-CM | POA: Diagnosis present

## 2015-04-20 DIAGNOSIS — I10 Essential (primary) hypertension: Secondary | ICD-10-CM | POA: Diagnosis present

## 2015-04-20 DIAGNOSIS — L03115 Cellulitis of right lower limb: Secondary | ICD-10-CM | POA: Diagnosis present

## 2015-04-20 DIAGNOSIS — A419 Sepsis, unspecified organism: Secondary | ICD-10-CM | POA: Diagnosis not present

## 2015-04-20 DIAGNOSIS — I509 Heart failure, unspecified: Secondary | ICD-10-CM | POA: Diagnosis not present

## 2015-04-20 HISTORY — DX: Chronic obstructive pulmonary disease, unspecified: J44.9

## 2015-04-20 HISTORY — DX: Unspecified atrial fibrillation: I48.91

## 2015-04-20 LAB — COMPREHENSIVE METABOLIC PANEL
ALK PHOS: 98 U/L (ref 38–126)
ALT: 25 U/L (ref 17–63)
ANION GAP: 18 — AB (ref 5–15)
AST: 53 U/L — ABNORMAL HIGH (ref 15–41)
Albumin: 3.2 g/dL — ABNORMAL LOW (ref 3.5–5.0)
BILIRUBIN TOTAL: 1.8 mg/dL — AB (ref 0.3–1.2)
BUN: 140 mg/dL — AB (ref 6–20)
CALCIUM: 9.2 mg/dL (ref 8.9–10.3)
CO2: 21 mmol/L — AB (ref 22–32)
CREATININE: 6.55 mg/dL — AB (ref 0.61–1.24)
Chloride: 95 mmol/L — ABNORMAL LOW (ref 101–111)
GFR calc Af Amer: 8 mL/min — ABNORMAL LOW (ref >60)
GFR calc non Af Amer: 7 mL/min — ABNORMAL LOW (ref >60)
GLUCOSE: 122 mg/dL — AB (ref 65–99)
Potassium: 5.7 mmol/L — ABNORMAL HIGH (ref 3.5–5.1)
Sodium: 134 mmol/L — ABNORMAL LOW (ref 135–145)
TOTAL PROTEIN: 6.3 g/dL — AB (ref 6.5–8.1)

## 2015-04-20 LAB — TROPONIN I: Troponin I: 0.07 ng/mL — ABNORMAL HIGH (ref <0.031)

## 2015-04-20 LAB — CBC
HEMATOCRIT: 33.4 % — AB (ref 40.0–52.0)
HEMOGLOBIN: 11 g/dL — AB (ref 13.0–18.0)
MCH: 29 pg (ref 26.0–34.0)
MCHC: 32.9 g/dL (ref 32.0–36.0)
MCV: 88.2 fL (ref 80.0–100.0)
Platelets: 160 10*3/uL (ref 150–440)
RBC: 3.78 MIL/uL — ABNORMAL LOW (ref 4.40–5.90)
RDW: 18.5 % — ABNORMAL HIGH (ref 11.5–14.5)
WBC: 10.9 10*3/uL — AB (ref 3.8–10.6)

## 2015-04-20 MED ORDER — ACETAMINOPHEN 325 MG PO TABS
650.0000 mg | ORAL_TABLET | Freq: Four times a day (QID) | ORAL | Status: DC | PRN
  Administered 2015-04-21 (×2): 650 mg via ORAL
  Filled 2015-04-20 (×2): qty 2

## 2015-04-20 MED ORDER — INSULIN ASPART 100 UNIT/ML ~~LOC~~ SOLN
6.0000 [IU] | Freq: Once | SUBCUTANEOUS | Status: AC
  Administered 2015-04-20: 6 [IU] via INTRAVENOUS

## 2015-04-20 MED ORDER — ONDANSETRON HCL 4 MG/2ML IJ SOLN
4.0000 mg | Freq: Four times a day (QID) | INTRAMUSCULAR | Status: DC | PRN
  Administered 2015-04-23: 4 mg via INTRAVENOUS
  Filled 2015-04-20: qty 2

## 2015-04-20 MED ORDER — HEPARIN SODIUM (PORCINE) 5000 UNIT/ML IJ SOLN
INTRAMUSCULAR | Status: AC
  Administered 2015-04-21: 5000 [IU] via SUBCUTANEOUS
  Filled 2015-04-20: qty 1

## 2015-04-20 MED ORDER — SODIUM CHLORIDE 0.9 % IV SOLN
1.0000 g | Freq: Once | INTRAVENOUS | Status: AC
  Administered 2015-04-20: 1 g via INTRAVENOUS

## 2015-04-20 MED ORDER — ONDANSETRON HCL 4 MG PO TABS: 4.0000 mg | ORAL_TABLET | Freq: Four times a day (QID) | ORAL | Status: DC | PRN

## 2015-04-20 MED ORDER — HEPARIN SODIUM (PORCINE) 5000 UNIT/ML IJ SOLN
5000.0000 [IU] | Freq: Three times a day (TID) | INTRAMUSCULAR | Status: DC
  Administered 2015-04-21: 5000 [IU] via SUBCUTANEOUS

## 2015-04-20 MED ORDER — ACETAMINOPHEN 650 MG RE SUPP: 650.0000 mg | Freq: Four times a day (QID) | RECTAL | Status: DC | PRN

## 2015-04-20 MED ORDER — SODIUM CHLORIDE 0.9 % IV SOLN
INTRAVENOUS | Status: DC
  Administered 2015-04-21 – 2015-04-22 (×2): via INTRAVENOUS

## 2015-04-20 MED ORDER — DEXTROSE 50 % IV SOLN
INTRAVENOUS | Status: AC
  Filled 2015-04-20: qty 50

## 2015-04-20 MED ORDER — INSULIN ASPART 100 UNIT/ML ~~LOC~~ SOLN
SUBCUTANEOUS | Status: AC
  Filled 2015-04-20: qty 6

## 2015-04-20 MED ORDER — DEXTROSE 50 % IV SOLN
50.0000 mL | Freq: Once | INTRAVENOUS | Status: AC
  Administered 2015-04-20: 50 mL via INTRAVENOUS

## 2015-04-20 MED ORDER — FUROSEMIDE 10 MG/ML IJ SOLN: 40.0000 mg | Freq: Once | INTRAMUSCULAR | Status: DC

## 2015-04-20 MED ORDER — SODIUM CHLORIDE 0.9 % IJ SOLN
3.0000 mL | Freq: Two times a day (BID) | INTRAMUSCULAR | Status: DC
  Administered 2015-04-21 – 2015-04-23 (×5): 3 mL via INTRAVENOUS

## 2015-04-20 MED ORDER — CALCIUM GLUCONATE 10 % IV SOLN
INTRAVENOUS | Status: AC
  Filled 2015-04-20: qty 10

## 2015-04-20 NOTE — ED Notes (Signed)
Patient transported to CT 

## 2015-04-20 NOTE — ED Notes (Signed)
Refreshments to family and ice to pt

## 2015-04-20 NOTE — H&P (Signed)
[place] [facility] [place] NAME: Philip Morris    MR#:  [id number]  DATE OF BIRTH:  Nov 28, 1924   DATE OF ADMISSION:  04/20/2015  PRIMARY CARE PHYSICIAN: Miguel Aschoff, MD   REQUESTING/REFERRING PHYSICIAN: Archie Balboa  CHIEF COMPLAINT:   Chief Complaint  Patient presents with  . Weakness  . Fall    HISTORY OF PRESENT ILLNESS:  Philip Morris  is a 81 y.o. male with a known history of systolic congestive heart failure, ejection fraction 10%, AICD placement, chronic kidney disease Baseline creatinine 1.5 who is presenting with generalized weakness. He describes progressive weakness over the last month or so duration to the point today where he actually fell. No loss of consciousness, no head trauma. He was unable to get off of the floor. In the emergency department noted to have markedly abnormal labs. Including acute kidney injury and hyperkalemia. Of note the patient is on hospice care at home for congestive heart failure  PAST MEDICAL HISTORY:   Past Medical History  Diagnosis Date  . Blood transfusion without reported diagnosis   . Congestive heart failure, unspecified   . Glaucoma   . Gout   . Hypertension 1970  . Arthritis 2012  . History of radiation therapy 1995    for prostate cancer  . Cancer 1995    prostate  . Skin cancer   . Atrial fibrillation   . COPD (chronic obstructive pulmonary disease)     PAST SURGICAL HISTORY:   Past Surgical History  Procedure Laterality Date  . Prostate surgery  2002  . Colonoscopy  2006  . Cardiac defibrillator placement  2014    SOCIAL HISTORY:   History  Substance Use Topics  . Smoking status: Never Smoker   . Smokeless tobacco: Never Used  . Alcohol Use: Yes    FAMILY HISTORY:  No family history on file.  DRUG ALLERGIES:   Allergies  Allergen Reactions  . Codeine Nausea Only  . Penicillins Swelling  . Tetanus Toxoid Hives    REVIEW OF SYSTEMS:  REVIEW OF  SYSTEMS:  CONSTITUTIONAL: Denies fevers, chills, positive for fatigue, weakness.  EYES: Denies blurred vision, double vision, or eye pain.  EARS, NOSE, THROAT: Denies tinnitus, ear pain, hearing loss.  RESPIRATORY: denies cough, shortness of breath, wheezing  CARDIOVASCULAR: Denies chest pain, palpitations, positive for edema. Denies orthopnea GASTROINTESTINAL: Denies nausea, vomiting, diarrhea, abdominal pain.  GENITOURINARY: Denies dysuria, hematuria.  ENDOCRINE: Denies nocturia or thyroid problems. HEMATOLOGIC AND LYMPHATIC: Denies easy bruising or bleeding.  SKIN: Denies rash or lesions.  MUSCULOSKELETAL: Denies pain in neck, back, shoulder, knees, hips, or further arthritic symptoms.  NEUROLOGIC: Denies paralysis, paresthesias.  PSYCHIATRIC: Denies anxiety or depressive symptoms. Otherwise full review of systems performed by me is negative.   MEDICATIONS AT HOME:   Prior to Admission medications   Medication Sig Start Date End Date Taking? Authorizing Provider  albuterol (PROVENTIL HFA;VENTOLIN HFA) 108 (90 BASE) MCG/ACT inhaler Inhale 2 puffs into the lungs every 6 (six) hours as needed for wheezing or shortness of breath.   Yes Historical Provider, MD  amiodarone (PACERONE) 200 MG tablet Take 200 mg by mouth daily.   Yes Historical Provider, MD  apixaban (ELIQUIS) 5 MG TABS tablet Take 5 mg by mouth 2 (two) times daily.   Yes Historical Provider, MD  brimonidine (ALPHAGAN) 0.2 % ophthalmic solution Place 1 drop into both eyes 2 (two) times daily.   Yes Historical Provider, MD  budesonide-formoterol [facility])  80-4.5 MCG/ACT inhaler Inhale 2 puffs into the lungs 2 (two) times daily.   Yes Historical Provider, MD  Calcium Carbonate-Vitamin D (CALCIUM + D PO) Take 1 tablet by mouth daily.   Yes Historical Provider, MD  dextromethorphan (DELSYM) 30 MG/5ML liquid Take 30 mg by mouth as needed for cough.   Yes Historical Provider, MD  diphenhydrAMINE (BENADRYL) 25 MG tablet Take 25 mg  by mouth 2 (two) times daily.   Yes Historical Provider, MD  doxazosin (CARDURA) 8 MG tablet Take 8 mg by mouth daily.   Yes Historical Provider, MD  febuxostat (ULORIC) 40 MG tablet Take 40 mg by mouth daily.   Yes Historical Provider, MD  furosemide (LASIX) 40 MG tablet Take 40 mg by mouth 3 (three) times daily.    Yes Historical Provider, MD  HYDROcodone-homatropine (HYCODAN) 5-1.5 MG/5ML syrup Take 5 mLs by mouth every 6 (six) hours as needed for cough.   Yes Historical Provider, MD  METOLAZONE PO Take 1 tablet by mouth daily.   Yes Historical Provider, MD  Multiple Vitamin (MULTIVITAMIN) tablet Take 1 tablet by mouth daily.   Yes Historical Provider, MD  potassium chloride (MICRO-K) 10 MEQ CR capsule Take 20 mEq by mouth daily.    Yes Historical Provider, MD  PREDNISONE PO Take 1 tablet by mouth as directed. Prednisone taper x 7 days; unknown dose/tablet strength 04/14/15  Yes Historical Provider, MD  promethazine (PHENERGAN) 25 MG tablet Take 25 mg by mouth every 6 (six) hours as needed for nausea or vomiting.   Yes Historical Provider, MD  timolol (TIMOPTIC) 0.5 % ophthalmic solution Place 1 drop into both eyes 2 (two) times daily.   Yes Historical Provider, MD  simvastatin (ZOCOR) 40 MG tablet Take 40 mg by mouth daily.    Historical Provider, MD      VITAL SIGNS:  Blood pressure 81/60, pulse 118, temperature 97.8 F (36.6 C), temperature source Oral, resp. rate 20, height 5\' 10"  (1.778 m), weight 176 lb (79.833 kg), SpO2 99 %.  PHYSICAL EXAMINATION:  VITAL SIGNS: Filed Vitals:   04/20/15 2255  BP: 81/60  Pulse: 118  Temp:   Resp: 20   GENERAL:81 y.o.male currently in no acute distress, chronically ill-appearing HEAD: Normocephalic, atraumatic.  EYES: Pupils equal, round, reactive to light. Extraocular muscles intact. No scleral icterus.  MOUTH: Moist mucosal membrane. Dentition intact. No abscess noted.  EAR, NOSE, THROAT: Clear without exudates. No external lesions.  NECK:  Supple. No thyromegaly. No nodules. No JVD.  PULMONARY: Clear to ascultation, without wheeze rails or rhonci. No use of accessory muscles, Good respiratory effort. good air entry bilaterally CHEST: Nontender to palpation.  CARDIOVASCULAR: S1 and S2. Tachycardic. No murmurs, rubs, or gallops. 2+ edema to the knee. Pedal pulses 2+ bilaterally.  GASTROINTESTINAL: Soft, nontender, nondistended. No masses. Positive bowel sounds. No hepatosplenomegaly.  MUSCULOSKELETAL: No swelling, clubbing, or edema. Range of motion full in all extremities.  NEUROLOGIC: Cranial nerves II through XII are intact. No gross focal neurological deficits. Sensation intact. Reflexes intact.  SKIN: No ulceration, lesions, rashes, or cyanosis. Skin warm and dry. Turgor intact.  PSYCHIATRIC: Mood, affect within normal limits. The patient is awake, alert and oriented x 3. Insight, judgment intact.    LABORATORY PANEL:   CBC  Recent Labs Lab 04/20/15 1957  WBC 10.9*  HGB 11.0*  HCT 33.4*  PLT 160   ------------------------------------------------------------------------------------------------------------------  Chemistries   Recent Labs Lab 04/20/15 1957  NA 134*  K 5.7*  CL 95*  CO2 21*  GLUCOSE 122*  BUN 140*  CREATININE 6.55*  CALCIUM 9.2  AST 53*  ALT 25  ALKPHOS 98  BILITOT 1.8*   ------------------------------------------------------------------------------------------------------------------  Cardiac Enzymes  Recent Labs Lab 04/20/15 1957  TROPONINI 0.07*   ------------------------------------------------------------------------------------------------------------------  RADIOLOGY:  Ct Head Wo Contrast  04/20/2015   CLINICAL DATA:  The patient fell when walking from the bathroom to the living room in his home and hit the back of his head on the wall. No symptoms currently reported.  EXAM: CT HEAD WITHOUT CONTRAST  TECHNIQUE: Contiguous axial images were obtained from the base of the  skull through the vertex without intravenous contrast.  COMPARISON:  Brain MR dated 09/18/2009.  FINDINGS: Diffusely enlarged ventricles and subarachnoid spaces. Patchy white matter low density in both cerebral hemispheres. No skull fracture, intracranial hemorrhage or paranasal sinus air-fluid levels.  IMPRESSION: 1. No skull fracture or intracranial hemorrhage. 2. Mildly progressive atrophy and chronic small vessel white matter ischemic changes.   Electronically Signed   By: Claudie Revering M.D.   On: 04/20/2015 21:31   Dg Chest Portable 1 View  04/20/2015   CLINICAL DATA:  Weakness for several days.  Some coughing.  EXAM: PORTABLE CHEST - 1 VIEW  COMPARISON:  04/07/2014.  FINDINGS: Mildly progressive enlargement of the cardiac silhouette. Breathing motion blurring with no gross change in prominence of the pulmonary vasculature and interstitial markings. No definite pleural fluid. Stable left subclavian AICD lead.  IMPRESSION: Mildly progressive cardiomegaly with grossly stable pulmonary vascular congestion and chronic interstitial lung disease.   Electronically Signed   By: Claudie Revering M.D.   On: 04/20/2015 21:13    EKG:   Orders placed or performed during the hospital encounter of 04/20/15  . ED EKG  . ED EKG    IMPRESSION AND PLAN:   81 year old Caucasian gentleman history of systolic heart failure EF 10% as was AICD placement presented with generalized weakness. Noted to have markedly lab abnormality on emergent Department lab work  1. Acute kidney injury on chronic kidney disease: Somewhat difficult situation given chronic heart failure, will hold diuretics for this evening, provide gentle IV fluid hydration. We will also consult nephrology and cardiology (follows with Dr. Josefa Half) 2. Hyperkalemia: Has received calcium, insulin, dextrose continue IV fluid hydration, follow potassium level 3. Elevated troponin: Place on telemetry, trend cardiac enzymes and 4. Systolic heart failure, chronic:  Will hold diuretics given above symptoms, consult Dr. Josefa Half 5. Chronic atrial fibrillation: Continue eliquis  All the records are reviewed and case discussed with ED provider. Management plans discussed with the patient, family and they are in agreement.  CODE STATUS: DO NOT RESUSCITATE  TOTAL TIME TAKING CARE OF THIS PATIENT: 45 minutes.    Sean Malinowski,  Karenann Cai.D on 04/20/2015 at 11:41 PM  Between 7am to 6pm - Pager - [phone]  After 6pm: House Pager: - [phone]  [facility]  Office  [phone]  CC: Primary care physician; Miguel Aschoff, MD

## 2015-04-20 NOTE — ED Provider Notes (Signed)
[facility] Emergency Department Provider Note   ____________________________________________  Time seen: 2030  I have reviewed the triage vital signs and the nursing notes.   HISTORY  Chief Complaint Weakness and Fall   History limited by: Not Limited   HPI Philip Morris is a 81 y.o. male He was brought in by family today because of increasing weakness. Family states the weakness is been getting worse for the past couple of weeks. It is comforted by lower extremity edema. Family thinks that the edema and weakness related to change medications that occurred a couple weeks ago. Additionally the patient has had some shortness of breath. Patient denies any chest pain. Patient denies any fevers.     Past Medical History  Diagnosis Date  . Blood transfusion without reported diagnosis   . Congestive heart failure, unspecified   . Glaucoma   . Gout   . Hypertension 1970  . Arthritis 2012  . History of radiation therapy 1995    for prostate cancer  . Cancer 1995    prostate  . Skin cancer   . Atrial fibrillation   . COPD (chronic obstructive pulmonary disease)     Patient Active Problem List   Diagnosis Date Noted  . Calculus of gallbladder with other cholecystitis, without mention of obstruction 02/22/2014  . Imaging abnormalities 02/22/2014  . Pancreatic cyst 02/22/2014    Past Surgical History  Procedure Laterality Date  . Prostate surgery  2002  . Colonoscopy  2006  . Cardiac defibrillator placement  2014    Current Outpatient Rx  Name  Route  Sig  Dispense  Refill  . apixaban (ELIQUIS) 5 MG TABS tablet   Oral   Take 5 mg by mouth 2 (two) times daily.         . brimonidine (ALPHAGAN) 0.2 % ophthalmic solution   Both Eyes   Place 1 drop into both eyes 2 (two) times daily.         . budesonide-formoterol (SYMBICORT) 80-4.5 MCG/ACT inhaler   Inhalation   Inhale 2 puffs into the lungs 2 (two) times daily.         . Calcium  Carbonate-Vitamin D (CALCIUM + D PO)   Oral   Take 1 tablet by mouth daily.         Marland Kitchen doxazosin (CARDURA) 8 MG tablet   Oral   Take 8 mg by mouth daily.         . furosemide (LASIX) 40 MG tablet   Oral   Take 40 mg by mouth 2 (two) times daily.         Marland Kitchen lisinopril (PRINIVIL,ZESTRIL) 5 MG tablet   Oral   Take 5 mg by mouth daily.         . metoprolol succinate (TOPROL-XL) 50 MG 24 hr tablet   Oral   Take 50 mg by mouth 2 (two) times daily. Take with or immediately following a meal.         . Multiple Vitamin (MULTIVITAMIN) tablet   Oral   Take 1 tablet by mouth daily.         . potassium chloride (MICRO-K) 10 MEQ CR capsule   Oral   Take 10 mEq by mouth daily.         . simvastatin (ZOCOR) 40 MG tablet   Oral   Take 40 mg by mouth daily.         . timolol (TIMOPTIC) 0.5 % ophthalmic solution   Both  Eyes   Place 1 drop into both eyes 2 (two) times daily.           Allergies Codeine and Penicillins  No family history on file.  Social History History  Substance Use Topics  . Smoking status: Never Smoker   . Smokeless tobacco: Never Used  . Alcohol Use: Yes    Review of Systems  Constitutional: Negative for fever. Cardiovascular: Negative for chest pain. Respiratory: positive for shortness of breath. Gastrointestinal: Negative for abdominal pain, vomiting and diarrhea. Genitourinary: Negative for dysuria. Musculoskeletal: Negative for back pain.bilateral lower extremity edema Skin: Negative for rash. Neurological: Negative for headaches, focal weakness or numbness.   10-point ROS otherwise negative.  ____________________________________________   PHYSICAL EXAM:  VITAL SIGNS: ED Triage Vitals  Enc Vitals Group     BP 04/20/15 2003 83/66 mmHg     Pulse Rate 04/20/15 2003 116     Resp 04/20/15 2003 24     Temp 04/20/15 2003 97.8 F (36.6 C)     Temp Source 04/20/15 2003 Oral     SpO2 04/20/15 2003 97 %     Weight 04/20/15  2003 176 lb (79.833 kg)     Height 04/20/15 2003 5\' 10"  (1.778 m)     Head Cir --      Peak Flow --      Pain Score 04/20/15 2004 5   Constitutional: Alert and oriented. Somewhat chronically ill-appearing Eyes: Conjunctivae are normal. PERRL. Normal extraocular movements. ENT   Head: Normocephalic and atraumatic.   Nose: No congestion/rhinnorhea.   Mouth/Throat: Mucous membranes are moist.   Neck: No stridor. Hematological/Lymphatic/Immunilogical: No cervical lymphadenopathy. Cardiovascular: Normal rate, regular rhythm.  No murmurs, rubs, or gallops. Respiratory: Normal respiratory effort without tachypnea nor retractions. Breath sounds are clear and equal bilaterally. No wheezes/rales/rhonchi. Gastrointestinal: Soft and nontender. No distention.  Genitourinary: Deferred Musculoskeletal: Normal range of motion in all extremities. Bilateral pitting edema in the lower extremities. Neurologic:  Normal speech and language. No gross focal neurologic deficits are appreciated. Speech is normal.  Skin:  Skin is warm, dry and intact. No rash noted. Psychiatric: Mood and affect are normal. Speech and behavior are normal. Patient exhibits appropriate insight and judgment.  ____________________________________________    LABS (pertinent positives/negatives)  Labs Reviewed  CBC - Abnormal; Notable for the following:    WBC 10.9 (*)    RBC 3.78 (*)    Hemoglobin 11.0 (*)    HCT 33.4 (*)    RDW 18.5 (*)    All other components within normal limits  COMPREHENSIVE METABOLIC PANEL - Abnormal; Notable for the following:    Sodium 134 (*)    Potassium 5.7 (*)    Chloride 95 (*)    CO2 21 (*)    Glucose, Bld 122 (*)    BUN 140 (*)    Creatinine, Ser 6.55 (*)    Total Protein 6.3 (*)    Albumin 3.2 (*)    AST 53 (*)    Total Bilirubin 1.8 (*)    GFR calc non Af Amer 7 (*)    GFR calc Af Amer 8 (*)    Anion gap 18 (*)    All other components within normal limits  TROPONIN I -  Abnormal; Notable for the following:    Troponin I 0.07 (*)    All other components within normal limits  BRAIN NATRIURETIC PEPTIDE  URINALYSIS COMPLETEWITH MICROSCOPIC ([facility])      ____________________________________________   EKG  EKG Time:  2010 Rate: 135 Rhythm: undetermined Axis: left axis Intervals: QTC 495 QRS: widened ST changes: no obvious ST elevation equivalent    ____________________________________________    RADIOLOGY  Head CT FINDINGS: Diffusely enlarged ventricles and subarachnoid spaces. Patchy white matter low density in both cerebral hemispheres. No skull fracture, intracranial hemorrhage or paranasal sinus air-fluid levels.  IMPRESSION: 1. No skull fracture or intracranial hemorrhage. 2. Mildly progressive atrophy and chronic small vessel white matter ischemic changes.  Chest x-ray FINDINGS: Mildly progressive enlargement of the cardiac silhouette. Breathing motion blurring with no gross change in prominence of the pulmonary vasculature and interstitial markings. No definite pleural fluid. Stable left subclavian AICD lead.  IMPRESSION: Mildly progressive cardiomegaly with grossly stable pulmonary vascular congestion and chronic interstitial lung disease. ____________________________________________   PROCEDURES  Procedure(s) performed: None  Critical Care performed: No  ____________________________________________   INITIAL IMPRESSION / ASSESSMENT AND PLAN / ED COURSE  Pertinent labs & imaging results that were available during my care of the patient were reviewed by me and considered in my medical decision making (see chart for details).  Patient was brought in by family because of increasing weakness and swelling. Lab work is concerning for hyperkalemia as well as acute kidney failure. I have written for medication help bring down her potassium. Initially patient's elevated troponin is of some concern however I doubt this  represents ACS.  ____________________________________________   FINAL CLINICAL IMPRESSION(S) / ED DIAGNOSES  Final diagnoses:  Hyperkalemia  Acute renal failure, unspecified acute renal failure type     Nance Pear, MD 04/20/15 2307

## 2015-04-20 NOTE — ED Notes (Addendum)
Pt presents to ED via EMS from home with c/o generalized weakness. Per EMS, pt fell today and was unable to get up. Pt notes, however that his weakness has been ongoing for "weeks." Pt reports shortness of breath; denies chest pain. Pt reports only pain is to inner right thigh r/t fall. Per EMS, pt has history of CHF. Pt reports that his BP is normally "low." Pt is awake and alert during triage. Of note, pt reports recent trouble with "fluid balance." Pt presents with pitting edema to bilateral lower extremities.

## 2015-04-21 DIAGNOSIS — I4891 Unspecified atrial fibrillation: Secondary | ICD-10-CM | POA: Diagnosis present

## 2015-04-21 DIAGNOSIS — R29898 Other symptoms and signs involving the musculoskeletal system: Secondary | ICD-10-CM | POA: Diagnosis present

## 2015-04-21 DIAGNOSIS — I5022 Chronic systolic (congestive) heart failure: Secondary | ICD-10-CM | POA: Diagnosis present

## 2015-04-21 LAB — BASIC METABOLIC PANEL
ANION GAP: 17 — AB (ref 5–15)
BUN: 149 mg/dL — ABNORMAL HIGH (ref 6–20)
CALCIUM: 9.2 mg/dL (ref 8.9–10.3)
CO2: 21 mmol/L — ABNORMAL LOW (ref 22–32)
Chloride: 95 mmol/L — ABNORMAL LOW (ref 101–111)
Creatinine, Ser: 6.85 mg/dL — ABNORMAL HIGH (ref 0.61–1.24)
GFR calc non Af Amer: 6 mL/min — ABNORMAL LOW (ref >60)
GFR, EST AFRICAN AMERICAN: 7 mL/min — AB (ref >60)
Glucose, Bld: 85 mg/dL (ref 65–99)
Potassium: 5.1 mmol/L (ref 3.5–5.1)
Sodium: 133 mmol/L — ABNORMAL LOW (ref 135–145)

## 2015-04-21 LAB — URINALYSIS COMPLETE WITH MICROSCOPIC (ARMC ONLY)
Bilirubin Urine: NEGATIVE
Glucose, UA: NEGATIVE mg/dL
HGB URINE DIPSTICK: NEGATIVE
Ketones, ur: NEGATIVE mg/dL
LEUKOCYTES UA: NEGATIVE
NITRITE: NEGATIVE
PH: 5 (ref 5.0–8.0)
Protein, ur: NEGATIVE mg/dL
SPECIFIC GRAVITY, URINE: 1.011 (ref 1.005–1.030)

## 2015-04-21 LAB — CBC
HCT: 31.6 % — ABNORMAL LOW (ref 40.0–52.0)
HEMOGLOBIN: 10.2 g/dL — AB (ref 13.0–18.0)
MCH: 28.6 pg (ref 26.0–34.0)
MCHC: 32.4 g/dL (ref 32.0–36.0)
MCV: 88.4 fL (ref 80.0–100.0)
Platelets: 156 10*3/uL (ref 150–440)
RBC: 3.58 MIL/uL — AB (ref 4.40–5.90)
RDW: 18.7 % — ABNORMAL HIGH (ref 11.5–14.5)
WBC: 9.5 10*3/uL (ref 3.8–10.6)

## 2015-04-21 LAB — TROPONIN I
Troponin I: 0.07 ng/mL — ABNORMAL HIGH (ref <0.031)
Troponin I: 0.06 ng/mL — ABNORMAL HIGH (ref <0.031)
Troponin I: 0.05 ng/mL — ABNORMAL HIGH (ref <0.031)

## 2015-04-21 LAB — BRAIN NATRIURETIC PEPTIDE: B NATRIURETIC PEPTIDE 5: 3614 pg/mL — AB (ref 0.0–100.0)

## 2015-04-21 LAB — CREATININE, SERUM
Creatinine, Ser: 6.61 mg/dL — ABNORMAL HIGH (ref 0.61–1.24)
GFR calc non Af Amer: 7 mL/min — ABNORMAL LOW (ref >60)
GFR, EST AFRICAN AMERICAN: 8 mL/min — AB (ref >60)

## 2015-04-21 MED ORDER — SIMVASTATIN 40 MG PO TABS
40.0000 mg | ORAL_TABLET | Freq: Every day | ORAL | Status: DC
  Administered 2015-04-21 – 2015-04-24 (×4): 40 mg via ORAL
  Filled 2015-04-21 (×4): qty 1

## 2015-04-21 MED ORDER — ALBUTEROL SULFATE (2.5 MG/3ML) 0.083% IN NEBU: 2.5000 mg | INHALATION_SOLUTION | Freq: Four times a day (QID) | RESPIRATORY_TRACT | Status: DC | PRN

## 2015-04-21 MED ORDER — FEBUXOSTAT 40 MG PO TABS
40.0000 mg | ORAL_TABLET | Freq: Every day | ORAL | Status: DC
  Administered 2015-04-22 – 2015-04-24 (×3): 40 mg via ORAL
  Filled 2015-04-21 (×4): qty 1

## 2015-04-21 MED ORDER — BRIMONIDINE TARTRATE 0.2 % OP SOLN
1.0000 [drp] | Freq: Two times a day (BID) | OPHTHALMIC | Status: DC
  Administered 2015-04-21 – 2015-04-24 (×8): 1 [drp] via OPHTHALMIC
  Filled 2015-04-21: qty 5

## 2015-04-21 MED ORDER — AMIODARONE HCL 200 MG PO TABS
200.0000 mg | ORAL_TABLET | Freq: Every day | ORAL | Status: DC
  Administered 2015-04-21 – 2015-04-24 (×4): 200 mg via ORAL
  Filled 2015-04-21 (×4): qty 1

## 2015-04-21 MED ORDER — VANCOMYCIN HCL IN DEXTROSE 1-5 GM/200ML-% IV SOLN
1000.0000 mg | INTRAVENOUS | Status: DC
  Administered 2015-04-24: 1000 mg via INTRAVENOUS
  Filled 2015-04-21: qty 200

## 2015-04-21 MED ORDER — ALBUTEROL SULFATE HFA 108 (90 BASE) MCG/ACT IN AERS: 2.0000 | INHALATION_SPRAY | Freq: Four times a day (QID) | RESPIRATORY_TRACT | Status: DC | PRN

## 2015-04-21 MED ORDER — APIXABAN 5 MG PO TABS: 5.0000 mg | ORAL_TABLET | Freq: Two times a day (BID) | ORAL | Status: DC

## 2015-04-21 MED ORDER — VANCOMYCIN HCL IN DEXTROSE 1-5 GM/200ML-% IV SOLN
1000.0000 mg | Freq: Once | INTRAVENOUS | Status: AC
  Administered 2015-04-21: 1000 mg via INTRAVENOUS
  Filled 2015-04-21 (×2): qty 200

## 2015-04-21 MED ORDER — APIXABAN 5 MG PO TABS
2.5000 mg | ORAL_TABLET | Freq: Two times a day (BID) | ORAL | Status: DC
  Administered 2015-04-21 (×2): 2.5 mg via ORAL
  Filled 2015-04-21: qty 1
  Filled 2015-04-21: qty 2

## 2015-04-21 MED ORDER — BUDESONIDE-FORMOTEROL FUMARATE 80-4.5 MCG/ACT IN AERO
2.0000 | INHALATION_SPRAY | Freq: Two times a day (BID) | RESPIRATORY_TRACT | Status: DC
  Administered 2015-04-21 – 2015-04-24 (×7): 2 via RESPIRATORY_TRACT
  Filled 2015-04-21: qty 6.9

## 2015-04-21 MED ORDER — MORPHINE SULFATE 2 MG/ML IJ SOLN
1.0000 mg | INTRAMUSCULAR | Status: DC | PRN
  Administered 2015-04-21 – 2015-04-22 (×2): 1 mg via INTRAVENOUS
  Filled 2015-04-21 (×2): qty 1

## 2015-04-21 MED ORDER — TIMOLOL MALEATE 0.5 % OP SOLN
1.0000 [drp] | Freq: Two times a day (BID) | OPHTHALMIC | Status: DC
  Administered 2015-04-21 – 2015-04-24 (×8): 1 [drp] via OPHTHALMIC
  Filled 2015-04-21: qty 5

## 2015-04-21 MED ORDER — DOXAZOSIN MESYLATE 4 MG PO TABS
8.0000 mg | ORAL_TABLET | Freq: Every day | ORAL | Status: DC
  Administered 2015-04-21: 8 mg via ORAL
  Filled 2015-04-21 (×2): qty 2

## 2015-04-21 MED ORDER — GUAIFENESIN 100 MG/5ML PO SOLN
5.0000 mL | ORAL | Status: DC | PRN
  Administered 2015-04-21: 100 mg via ORAL
  Filled 2015-04-21: qty 10

## 2015-04-21 NOTE — Progress Notes (Signed)
PT Cancellation Note  Patient Details Name: Philip Morris MRN: [id number] DOB: 08/05/1924   Cancelled Treatment:    Reason Eval/Treat Not Completed:  (see PT cancellation note for further details.) PT consult received and chart reviewed. Pt currently with cardio consult pending. Pt with EF of 10% and elevated troponin. Will hold PT consult until medically cleared.  Greggory Stallion, PT, DPT [phone]   Philip Morris 04/21/2015, 9:22 AM

## 2015-04-21 NOTE — Progress Notes (Signed)
[facility] [place] NAME: Philip Morris    MR#:  [id number]  DATE OF BIRTH:  08-13-1924  SUBJECTIVE:  Presented overnight with weakness and fall. Now has significant pain in the right thigh with erythema, he and his son feel that this started before the fall.    REVIEW OF SYSTEMS:  Review of Systems  Constitutional: Positive for malaise/fatigue.  Respiratory: Negative for shortness of breath.   Cardiovascular: Positive for leg swelling. Negative for chest pain and PND.  Gastrointestinal: Negative for abdominal pain.  Genitourinary: Negative for dysuria.  Skin:       Bruising and erythma over abdomen, right upper thigh  Neurological: Positive for dizziness and weakness. Negative for focal weakness and headaches.  Endo/Heme/Allergies: Bruises/bleeds easily.     DRUG ALLERGIES:   Allergies  Allergen Reactions  . Codeine Nausea Only  . Penicillins Swelling  . Tetanus Toxoid Hives    VITALS:  Blood pressure 124/93, pulse 100, temperature 97.4 F (36.3 C), temperature source Oral, resp. rate 20, height 5\' 10"  (1.778 m), weight 75.705 kg (166 lb 14.4 oz), SpO2 100 %.  PHYSICAL EXAMINATION:  Physical Exam  Constitutional: He is oriented to person, place, and time. He appears well-developed and well-nourished.  HENT:  Head: Normocephalic and atraumatic.  Eyes: EOM are normal. Pupils are equal, round, and reactive to light.  Neck: Normal range of motion.  Cardiovascular: Normal rate, regular rhythm and intact distal pulses.   Murmur heard. Pulmonary/Chest: Effort normal and breath sounds normal.  Abdominal: Soft. Bowel sounds are normal.  Lymphadenopathy:    He has no cervical adenopathy.  Neurological: He is alert and oriented to person, place, and time.  Skin: Skin is warm and dry.  Bruising over abdomen, right upper thigh, erythema/induration/warmth and tenderness on the right posterior upper/inner thigh  Psychiatric:  He has a normal mood and affect.     LABORATORY PANEL:   CBC  Recent Labs Lab 04/21/15 0322  WBC 9.5  HGB 10.2*  HCT 31.6*  PLT 156   ------------------------------------------------------------------------------------------------------------------  Chemistries   Recent Labs Lab 04/20/15 1957  04/21/15 0327  NA 134*  --  133*  K 5.7*  --  5.1  CL 95*  --  95*  CO2 21*  --  21*  GLUCOSE 122*  --  85  BUN 140*  --  149*  CREATININE 6.55*  < > 6.85*  CALCIUM 9.2  --  9.2  AST 53*  --   --   ALT 25  --   --   ALKPHOS 98  --   --   BILITOT 1.8*  --   --   < > = values in this interval not displayed. ------------------------------------------------------------------------------------------------------------------  Cardiac Enzymes  Recent Labs Lab 04/21/15 0925  TROPONINI 0.06*   ------------------------------------------------------------------------------------------------------------------  RADIOLOGY:  Ct Head Wo Contrast  04/20/2015   CLINICAL DATA:  The patient fell when walking from the bathroom to the living room in his home and hit the back of his head on the wall. No symptoms currently reported.  EXAM: CT HEAD WITHOUT CONTRAST  TECHNIQUE: Contiguous axial images were obtained from the base of the skull through the vertex without intravenous contrast.  COMPARISON:  Brain MR dated 09/18/2009.  FINDINGS: Diffusely enlarged ventricles and subarachnoid spaces. Patchy white matter low density in both cerebral hemispheres. No skull fracture, intracranial hemorrhage or paranasal sinus air-fluid levels.  IMPRESSION: 1. No skull fracture or intracranial hemorrhage. 2.  Mildly progressive atrophy and chronic small vessel white matter ischemic changes.   Electronically Signed   By: Claudie Revering M.D.   On: 04/20/2015 21:31   Dg Chest Portable 1 View  04/20/2015   CLINICAL DATA:  Weakness for several days.  Some coughing.  EXAM: PORTABLE CHEST - 1 VIEW  COMPARISON:  04/07/2014.   FINDINGS: Mildly progressive enlargement of the cardiac silhouette. Breathing motion blurring with no gross change in prominence of the pulmonary vasculature and interstitial markings. No definite pleural fluid. Stable left subclavian AICD lead.  IMPRESSION: Mildly progressive cardiomegaly with grossly stable pulmonary vascular congestion and chronic interstitial lung disease.   Electronically Signed   By: Claudie Revering M.D.   On: 04/20/2015 21:13    EKG:   Orders placed or performed during the hospital encounter of 04/20/15  . ED EKG  . ED EKG  . EKG 12-Lead  . EKG 12-Lead    ASSESSMENT AND PLAN:   Principal Problem:   Acute-on-chronic kidney injury Active Problems:   Hyperkalemia   Systolic heart failure, chronic   Hypertension, essential   Hyperlipidemia   Muscular deconditioning   Chronic systolic congestive heart failure   Atrial fibrillation    1. Acute kidney injury on chronic kidney disease:  - appreciate nephrology consultation - continue IVF - hyperkalemia improved - not a good dialysis candidate due to advanced heart failure  2. Hyperkalemia: improved - Has received calcium, insulin, dextrose continue IV fluid hydration, follow potassium level  3. Elevated troponin:  - cardiology consultation appriciated - continue telemetry, trend cardiac enzymes  4. Systolic heart failure, chronic:  - Will hold diuretics given above symptoms  5. Chronic atrial fibrillation:  - rate controlled - Continue eliquis   All the records are reviewed and case discussed with Care Management/Social Worker  Management plans discussed with the patient, family and they are in agreement.  CODE STATUS: DNR  TOTAL TIME TAKING CARE OF THIS PATIENT: 35 minutes.   Myrtis Ser M.D on 04/21/2015 at 2:57 PM  Between 7am to 6pm - Pager - [phone]  After 6pm go to [email or web address] - password [email or web address] [facility]  Office  [phone]  CC: Primary care  physician; Miguel Aschoff, MD

## 2015-04-21 NOTE — Consult Note (Signed)
Date: 04/21/2015                  Patient Name:  Philip Morris  MRN: [id number]  DOB: 04-23-1924  Age / Sex: 81 y.o., male         PCP: Miguel Aschoff, MD                 Service Requesting Consult: IM                 Reason for Consult: ARF            History of Present Illness: Patient is a 81 y.o. male with a PMHx of severe CHF and AICD, who was admitted to [facility] on 04/20/2015 for evaluation of weakness.  He describes progressive weakness over the last month or so duration to the point today where he actually fell. No loss of consciousness, no head trauma. He was unable to get off of the floor. In the emergency department noted to have markedly abnormal labs. Including acute kidney injury and hyperkalemia. Of note the patient is on hospice care at home for congestive heart failure  Today, he is still feeling weak. S Cr and BUN are critically elevated. K is 5.1  No imaging results available Bladder scan done at bedside shows Urine > 400 cc    Medications: Outpatient medications: Prescriptions prior to admission  Medication Sig Dispense Refill Last Dose  . albuterol (PROVENTIL HFA;VENTOLIN HFA) 108 (90 BASE) MCG/ACT inhaler Inhale 2 puffs into the lungs every 6 (six) hours as needed for wheezing or shortness of breath.   04/20/2015 at Unknown time  . amiodarone (PACERONE) 200 MG tablet Take 200 mg by mouth daily.   two weeks  . apixaban (ELIQUIS) 5 MG TABS tablet Take 5 mg by mouth 2 (two) times daily.   04/20/2015 at Unknown time  . brimonidine (ALPHAGAN) 0.2 % ophthalmic solution Place 1 drop into both eyes 2 (two) times daily.   04/20/2015 at Unknown time  . budesonide-formoterol (SYMBICORT) 80-4.5 MCG/ACT inhaler Inhale 2 puffs into the lungs 2 (two) times daily.   04/20/2015 at Unknown time  . Calcium Carbonate-Vitamin D (CALCIUM + D PO) Take 1 tablet by mouth daily.   04/20/2015 at Unknown time  . dextromethorphan (DELSYM) 30 MG/5ML liquid Take 30 mg by mouth as needed for cough.    Past Week at Unknown time  . diphenhydrAMINE (BENADRYL) 25 MG tablet Take 25 mg by mouth 2 (two) times daily.   04/20/2015 at Unknown time  . doxazosin (CARDURA) 8 MG tablet Take 8 mg by mouth daily.   04/20/2015 at Unknown time  . febuxostat (ULORIC) 40 MG tablet Take 40 mg by mouth daily.   two weeks  . furosemide (LASIX) 40 MG tablet Take 40 mg by mouth 3 (three) times daily.    04/20/2015 at Unknown time  . HYDROcodone-homatropine (HYCODAN) 5-1.5 MG/5ML syrup Take 5 mLs by mouth every 6 (six) hours as needed for cough.   04/19/2015 at Unknown time  . METOLAZONE PO Take 1 tablet by mouth daily.   two weeks  . Multiple Vitamin (MULTIVITAMIN) tablet Take 1 tablet by mouth daily.   04/20/2015 at Unknown time  . potassium chloride (MICRO-K) 10 MEQ CR capsule Take 20 mEq by mouth daily.    04/20/2015 at Unknown time  . PREDNISONE PO Take 1 tablet by mouth as directed. Prednisone taper x 7 days; unknown dose/tablet strength   04/20/2015 at Unknown time  .  promethazine (PHENERGAN) 25 MG tablet Take 25 mg by mouth every 6 (six) hours as needed for nausea or vomiting.   04/20/2015 at Unknown time  . timolol (TIMOPTIC) 0.5 % ophthalmic solution Place 1 drop into both eyes 2 (two) times daily.   04/20/2015 at Unknown time  . simvastatin (ZOCOR) 40 MG tablet Take 40 mg by mouth daily.   two weeks    Current medications: Current Facility-Administered Medications  Medication Dose Route Frequency Provider Last Rate Last Dose  . 0.9 %  sodium chloride infusion   Intravenous Continuous Lytle Butte, MD 50 mL/hr at 04/21/15 0223    . acetaminophen (TYLENOL) tablet 650 mg  650 mg Oral Q6H PRN Lytle Butte, MD   650 mg at 04/21/15 2595   Or  . acetaminophen (TYLENOL) suppository 650 mg  650 mg Rectal Q6H PRN Lytle Butte, MD      . albuterol (PROVENTIL) (2.5 MG/3ML) 0.083% nebulizer solution 2.5 mg  2.5 mg Nebulization Q6H PRN Lytle Butte, MD      . amiodarone (PACERONE) tablet 200 mg  200 mg Oral Daily Lytle Butte, MD    200 mg at 04/21/15 1152  . apixaban (ELIQUIS) tablet 2.5 mg  2.5 mg Oral BID Lytle Butte, MD   2.5 mg at 04/21/15 1153  . brimonidine (ALPHAGAN) 0.2 % ophthalmic solution 1 drop  1 drop Both Eyes BID Lytle Butte, MD   1 drop at 04/21/15 1201  . budesonide-formoterol (SYMBICORT) 80-4.5 MCG/ACT inhaler 2 puff  2 puff Inhalation BID Lytle Butte, MD   2 puff at 04/21/15 1012  . doxazosin (CARDURA) tablet 8 mg  8 mg Oral Daily Lytle Butte, MD   8 mg at 04/21/15 1152  . febuxostat (ULORIC) tablet 40 mg  40 mg Oral Daily Lytle Butte, MD   40 mg at 04/21/15 1000  . guaiFENesin (ROBITUSSIN) 100 MG/5ML solution 100 mg  5 mL Oral Q4H PRN Lytle Butte, MD   100 mg at 04/21/15 0310  . ondansetron (ZOFRAN) tablet 4 mg  4 mg Oral Q6H PRN Lytle Butte, MD       Or  . ondansetron [facility]) injection 4 mg  4 mg Intravenous Q6H PRN Lytle Butte, MD      . simvastatin (ZOCOR) tablet 40 mg  40 mg Oral Daily Lytle Butte, MD   40 mg at 04/21/15 1154  . sodium chloride 0.9 % injection 3 mL  3 mL Intravenous Q12H Lytle Butte, MD   3 mL at 04/21/15 0028  . timolol (TIMOPTIC) 0.5 % ophthalmic solution 1 drop  1 drop Both Eyes BID Lytle Butte, MD   1 drop at 04/21/15 1200  . vancomycin (VANCOCIN) IVPB 1000 mg/200 mL premix  1,000 mg Intravenous Once Aldean Jewett, MD      . Derrill Memo ON 04/24/2015] vancomycin (VANCOCIN) IVPB 1000 mg/200 mL premix  1,000 mg Intravenous Q72H Vira Blanco, [facility]          Allergies: Allergies  Allergen Reactions  . Codeine Nausea Only  . Penicillins Swelling  . Tetanus Toxoid Hives      Past Medical History: Past Medical History  Diagnosis Date  . Blood transfusion without reported diagnosis   . Congestive heart failure, unspecified   . Glaucoma   . Gout   . Hypertension 1970  . Arthritis 2012  . History of radiation therapy 1995    for prostate  cancer  . Cancer 1995    prostate  . Skin cancer   . Atrial fibrillation   . COPD (chronic obstructive pulmonary  disease)      Past Surgical History: Past Surgical History  Procedure Laterality Date  . Prostate surgery  2002  . Colonoscopy  2006  . Cardiac defibrillator placement  2014     Family History: History reviewed. No pertinent family history.   Social History: History   Social History  . Marital Status: Married    Spouse Name: N/A  . Number of Children: N/A  . Years of Education: N/A   Occupational History  . Not on file.   Social History Main Topics  . Smoking status: Never Smoker   . Smokeless tobacco: Never Used  . Alcohol Use: Yes  . Drug Use: No  . Sexual Activity: Not Currently   Other Topics Concern  . Not on file   Social History Narrative     Review of Systems: As per HPI  Vital Signs: Blood pressure 124/93, pulse 100, temperature 97.4 F (36.3 C), temperature source Oral, resp. rate 20, height 5\' 10"  (1.778 m), weight 75.705 kg (166 lb 14.4 oz), SpO2 100 %.  Weight trends: Filed Weights   04/20/15 2003 04/21/15 0157  Weight: 79.833 kg (176 lb) 75.705 kg (166 lb 14.4 oz)    Physical Exam: General: NAD,   Head: Normocephalic, atraumatic.  Eyes: Anicteric, EOMI  Nose: Mucous membranes Dry, not inflammed, nonerythematous.  Throat: Oropharynx nonerythematous, no exudate appreciated.   Neck: No deformities, masses, or tenderness noted.Supple, No carotid Bruits, no JVD.  Lungs:  Normal respiratory effort. Clear to auscultation BL without crackles or wheezes.  Heart: RRR. S1 and S2 normal without gallop,or rubs.  Abdomen:  BS normoactive. Soft, + Distended, non-tender.  No masses or organomegaly.  Extremities: No pretibial edema.  Neurologic: Alert, conversive, non focal  Skin: No visible rashes, scars.    Lab results: Basic Metabolic Panel:  Recent Labs Lab 04/20/15 1957 04/21/15 0322 04/21/15 0327  NA 134*  --  133*  K 5.7*  --  5.1  CL 95*  --  95*  CO2 21*  --  21*  GLUCOSE 122*  --  85  BUN 140*  --  149*  CREATININE 6.55*  6.61* 6.85*  CALCIUM 9.2  --  9.2    Liver Function Tests:  Recent Labs Lab 04/20/15 1957  AST 53*  ALT 25  ALKPHOS 98  BILITOT 1.8*  PROT 6.3*  ALBUMIN 3.2*   No results for input(s): LIPASE, AMYLASE in the last 168 hours. No results for input(s): AMMONIA in the last 168 hours.  CBC:  Recent Labs Lab 04/20/15 1957 04/21/15 0322  WBC 10.9* 9.5  HGB 11.0* 10.2*  HCT 33.4* 31.6*  MCV 88.2 88.4  PLT 160 156    Cardiac Enzymes:  Recent Labs Lab 04/20/15 1957 04/21/15 0322 04/21/15 0925  TROPONINI 0.07* 0.07* 0.06*    BNP: Invalid input(s): POCBNP  CBG: No results for input(s): GLUCAP in the last 168 hours.  Microbiology: Results for orders placed or performed in visit on 09/18/14  Culture, blood (single)     Status: None   Collection Time: 09/18/14  7:13 PM  Result Value Ref Range Status   Micro Text Report   Final       COMMENT                   NO GROWTH AEROBICALLY/ANAEROBICALLY IN 5 DAYS  ANTIBIOTIC                                                      Culture, blood (single)     Status: None   Collection Time: 09/18/14  7:13 PM  Result Value Ref Range Status   Micro Text Report   Final       COMMENT                   NO GROWTH AEROBICALLY/ANAEROBICALLY IN 5 DAYS   ANTIBIOTIC                                                        Coagulation Studies: No results for input(s): LABPROT, INR in the last 72 hours.  Urinalysis:  Recent Labs  04/21/15 0645  COLORURINE YELLOW*  LABSPEC 1.011  PHURINE 5.0  GLUCOSEU NEGATIVE  HGBUR NEGATIVE  BILIRUBINUR NEGATIVE  KETONESUR NEGATIVE  PROTEINUR NEGATIVE  NITRITE NEGATIVE  LEUKOCYTESUR NEGATIVE      Imaging: Ct Head Wo Contrast  04/20/2015   CLINICAL DATA:  The patient fell when walking from the bathroom to the living room in his home and hit the back of his head on the wall. No symptoms currently reported.  EXAM: CT HEAD WITHOUT CONTRAST  TECHNIQUE: Contiguous axial images were  obtained from the base of the skull through the vertex without intravenous contrast.  COMPARISON:  Brain MR dated 09/18/2009.  FINDINGS: Diffusely enlarged ventricles and subarachnoid spaces. Patchy white matter low density in both cerebral hemispheres. No skull fracture, intracranial hemorrhage or paranasal sinus air-fluid levels.  IMPRESSION: 1. No skull fracture or intracranial hemorrhage. 2. Mildly progressive atrophy and chronic small vessel white matter ischemic changes.   Electronically Signed   By: Claudie Revering M.D.   On: 04/20/2015 21:31   Dg Chest Portable 1 View  04/20/2015   CLINICAL DATA:  Weakness for several days.  Some coughing.  EXAM: PORTABLE CHEST - 1 VIEW  COMPARISON:  04/07/2014.  FINDINGS: Mildly progressive enlargement of the cardiac silhouette. Breathing motion blurring with no gross change in prominence of the pulmonary vasculature and interstitial markings. No definite pleural fluid. Stable left subclavian AICD lead.  IMPRESSION: Mildly progressive cardiomegaly with grossly stable pulmonary vascular congestion and chronic interstitial lung disease.   Electronically Signed   By: Claudie Revering M.D.   On: 04/20/2015 21:13      Assessment & Plan: Pt is a 81 y.o. yo male with a PMHX of CHF ef 10 %, AICD, was admitted to [facility] on 04/20/2015 with weakness  1. ARF. Oliguric with critically elevated BUN and Cr ? If weakness and general symptoms are uremia Insert Foley May need renal imaging May continue slow iv fluids  Follow Vanc levels  Avoid nephrotoxins Dose meds for CrCl < 10 Will have to discuss with primary team regarding feasibility of dialysis in this patient who was on hospice until this admission Due to severe cardiac dysfunction, will make a poor candidate for chronic dialysis   2. CKD st 4 - Baseline Cr 2.92/GFR 22 back in October 2015  3. Hyperkalemia - improved - low K diet

## 2015-04-21 NOTE — Care Management Note (Signed)
Case Management Note  Patient Details  Name: Philip Morris MRN: [id number] Date of Birth: May 31, 1924  Subjective/Objective:                   Patient sitting in bed with son "Brooke Bonito" at bedside answering most of my assessment questions. Patient states he "was walking independently up until one week ago now requiring assistance from rolling walker". Patient states that he is on chronic O2 and has asked for hospital bed. Son states they have a transfer chair also in the home. Son states that he lives with patient and assists with daily needs. Patient and son would like for patient to return home at discharge.  Action/Plan: Notified Karen with [facility] of patient admission. Santiago Glad states patient has supportive family with daughter in [place]. Hospital bed to be delivered today to patient's home. RNCM to follow for EMS needs.    Expected Discharge Date:  05-10-2015               Expected Discharge Plan:     In-House Referral:     Discharge planning Services  CM Consult  Post Acute Care Choice:    Choice offered to:  Patient, Adult Children  DME Arranged:    DME Agency:     HH Arranged:    [place] Agency:  [facility]  Status of Service:     Medicare Important Message Given:    Date Medicare IM Given:    Medicare IM give by:    Date Additional Medicare IM Given:    Additional Medicare Important Message give by:     If discussed at [place] of Stay Meetings, dates discussed:    Additional Comments:  Marshell Garfinkel, RN 04/21/2015, 10:16 AM

## 2015-04-21 NOTE — Consult Note (Signed)
[facility]  CARDIOLOGY CONSULT NOTE  Patient ID: BRALEN WILTGEN MRN: [id number] DOB/AGE: 08-18-1924 81 y.o.  Admit date: 04/20/2015 Referring Physician Edgar Frisk Primary Physician Miguel Aschoff Primary Cardiologist Miquel Dunn Reason for Consultation Chronic systolic congestive heart failure  HPI: The patient is an 81 year old gentleman with history of nonischemic cardiomyopathy, chronic systolic congestive heart failure, chronic atrial fibrillation and hypertension. Patient is status post ICD 05/17/13. Certainly, the patient has had difficulty with her overload, pulmonary edema, and peripheral edema requiring high doses of diuretic therapy. Yesterday, the patient was in his usual state of health with recent history of physical decline. The patient had difficulty ambulating and fell. Patient was brought to [facility] emergency room where he was noted to be an acute on chronic renal failure with a BUN and creatinine of 149 and 6.85, respectively. The patient denies chest pain or shortness of breath but does have 2+ peripheral edema. He shouldn't is living at home however his son has been living with him for several weeks help take care of him.  Review of systems complete and found to be negative unless listed above     Past Medical History  Diagnosis Date  . Blood transfusion without reported diagnosis   . Congestive heart failure, unspecified   . Glaucoma   . Gout   . Hypertension 1970  . Arthritis 2012  . History of radiation therapy 1995    for prostate cancer  . Cancer 1995    prostate  . Skin cancer   . Atrial fibrillation   . COPD (chronic obstructive pulmonary disease)     Past Surgical History  Procedure Laterality Date  . Prostate surgery  2002  . Colonoscopy  2006  . Cardiac defibrillator placement  2014    Prescriptions prior to admission  Medication Sig Dispense Refill Last Dose  . albuterol (PROVENTIL HFA;VENTOLIN HFA) 108 (90  BASE) MCG/ACT inhaler Inhale 2 puffs into the lungs every 6 (six) hours as needed for wheezing or shortness of breath.   04/20/2015 at Unknown time  . amiodarone (PACERONE) 200 MG tablet Take 200 mg by mouth daily.   two weeks  . apixaban (ELIQUIS) 5 MG TABS tablet Take 5 mg by mouth 2 (two) times daily.   04/20/2015 at Unknown time  . brimonidine (ALPHAGAN) 0.2 % ophthalmic solution Place 1 drop into both eyes 2 (two) times daily.   04/20/2015 at Unknown time  . budesonide-formoterol (SYMBICORT) 80-4.5 MCG/ACT inhaler Inhale 2 puffs into the lungs 2 (two) times daily.   04/20/2015 at Unknown time  . Calcium Carbonate-Vitamin D (CALCIUM + D PO) Take 1 tablet by mouth daily.   04/20/2015 at Unknown time  . dextromethorphan (DELSYM) 30 MG/5ML liquid Take 30 mg by mouth as needed for cough.   Past Week at Unknown time  . diphenhydrAMINE (BENADRYL) 25 MG tablet Take 25 mg by mouth 2 (two) times daily.   04/20/2015 at Unknown time  . doxazosin (CARDURA) 8 MG tablet Take 8 mg by mouth daily.   04/20/2015 at Unknown time  . febuxostat (ULORIC) 40 MG tablet Take 40 mg by mouth daily.   two weeks  . furosemide (LASIX) 40 MG tablet Take 40 mg by mouth 3 (three) times daily.    04/20/2015 at Unknown time  . HYDROcodone-homatropine (HYCODAN) 5-1.5 MG/5ML syrup Take 5 mLs by mouth every 6 (six) hours as needed for cough.   04/19/2015 at Unknown time  . METOLAZONE PO Take 1 tablet by  mouth daily.   two weeks  . Multiple Vitamin (MULTIVITAMIN) tablet Take 1 tablet by mouth daily.   04/20/2015 at Unknown time  . potassium chloride (MICRO-K) 10 MEQ CR capsule Take 20 mEq by mouth daily.    04/20/2015 at Unknown time  . PREDNISONE PO Take 1 tablet by mouth as directed. Prednisone taper x 7 days; unknown dose/tablet strength   04/20/2015 at Unknown time  . promethazine (PHENERGAN) 25 MG tablet Take 25 mg by mouth every 6 (six) hours as needed for nausea or vomiting.   04/20/2015 at Unknown time  . timolol (TIMOPTIC) 0.5 % ophthalmic solution  Place 1 drop into both eyes 2 (two) times daily.   04/20/2015 at Unknown time  . simvastatin (ZOCOR) 40 MG tablet Take 40 mg by mouth daily.   two weeks   History   Social History  . Marital Status: Married    Spouse Name: N/A  . Number of Children: N/A  . Years of Education: N/A   Occupational History  . Not on file.   Social History Main Topics  . Smoking status: Never Smoker   . Smokeless tobacco: Never Used  . Alcohol Use: Yes  . Drug Use: No  . Sexual Activity: Not Currently   Other Topics Concern  . Not on file   Social History Narrative    History reviewed. No pertinent family history.    Review of systems complete and found to be negative unless listed above      Physical Exam: Blood pressure 87/60, pulse 107, temperature 97.5 F (36.4 C), temperature source Oral, resp. rate 18, height 5\' 10"  (1.778 m), weight 75.705 kg (166 lb 14.4 oz), SpO2 100 %.    General appearance: alert Head: Normocephalic, without obvious abnormality, atraumatic Eyes: conjunctivae/corneas clear. PERRL, EOM's intact. Fundi benign. Neck: no adenopathy, no carotid bruit, no JVD, supple, symmetrical, trachea midline and thyroid not enlarged, symmetric, no tenderness/mass/nodules Resp: clear to auscultation bilaterally Chest wall: no tenderness Cardio: regular rate and rhythm, S1, S2 normal, no murmur, click, rub or gallop GI: soft, non-tender; bowel sounds normal; no masses,  no organomegaly Pulses: 2+ and symmetric Skin: Skin color, texture, turgor normal. No rashes or lesions Neurologic: Grossly normal Labs:   Lab Results  Component Value Date   WBC 9.5 04/21/2015   HGB 10.2* 04/21/2015   HCT 31.6* 04/21/2015   MCV 88.4 04/21/2015   PLT 156 04/21/2015    Recent Labs Lab 04/20/15 1957  04/21/15 0327  NA 134*  --  133*  K 5.7*  --  5.1  CL 95*  --  95*  CO2 21*  --  21*  BUN 140*  --  149*  CREATININE 6.55*  < > 6.85*  CALCIUM 9.2  --  9.2  PROT 6.3*  --   --   BILITOT  1.8*  --   --   ALKPHOS 98  --   --   ALT 25  --   --   AST 53*  --   --   GLUCOSE 122*  --  85  < > = values in this interval not displayed. Lab Results  Component Value Date   TROPONINI 0.07* 04/21/2015   No results found for: CHOL No results found for: HDL No results found for: LDLCALC No results found for: TRIG No results found for: CHOLHDL No results found for: LDLDIRECT    Radiology: Dg Chest 2 View  04/09/2015   CLINICAL DATA:  Nonproductive cough for  3-4 months.  EXAM: CHEST  2 VIEW  COMPARISON:  03/17/2015 and 02/23/2015 and 09/21/2014  FINDINGS: Chronic cardiomegaly. AICD in place. Pulmonary vascularity is normal. Tiny bilateral effusions, unchanged.  There is diffuse interstitial accentuation which is unchanged since 03/17/2015 but progressed since 09/21/2014.  IMPRESSION: No change since the prior exam. Chronic marked cardiomegaly with persistent interstitial accentuation and tiny effusions.   Electronically Signed   By: Lorriane Shire M.D.   On: 04/09/2015 08:32   Ct Head Wo Contrast  04/20/2015   CLINICAL DATA:  The patient fell when walking from the bathroom to the living room in his home and hit the back of his head on the wall. No symptoms currently reported.  EXAM: CT HEAD WITHOUT CONTRAST  TECHNIQUE: Contiguous axial images were obtained from the base of the skull through the vertex without intravenous contrast.  COMPARISON:  Brain MR dated 09/18/2009.  FINDINGS: Diffusely enlarged ventricles and subarachnoid spaces. Patchy white matter low density in both cerebral hemispheres. No skull fracture, intracranial hemorrhage or paranasal sinus air-fluid levels.  IMPRESSION: 1. No skull fracture or intracranial hemorrhage. 2. Mildly progressive atrophy and chronic small vessel white matter ischemic changes.   Electronically Signed   By: Claudie Revering M.D.   On: 04/20/2015 21:31   Dg Chest Portable 1 View  04/20/2015   CLINICAL DATA:  Weakness for several days.  Some coughing.  EXAM:  PORTABLE CHEST - 1 VIEW  COMPARISON:  04/07/2014.  FINDINGS: Mildly progressive enlargement of the cardiac silhouette. Breathing motion blurring with no gross change in prominence of the pulmonary vasculature and interstitial markings. No definite pleural fluid. Stable left subclavian AICD lead.  IMPRESSION: Mildly progressive cardiomegaly with grossly stable pulmonary vascular congestion and chronic interstitial lung disease.   Electronically Signed   By: Claudie Revering M.D.   On: 04/20/2015 21:13    EKG:   ASSESSMENT AND PLAN:  81 year old gentleman with known nonischemic cardiomyopathy, chronic systolic congestive heart failure, chronic in nature fibrillation who presents with chief complaint of generalized weakness found to be in chronic enough failure with markedly elevated bun and creatinine. The patient has borderline elevated troponin likely due to demand supply ischemia and nontender to acute coronary syndrome.  #1  Agree with overall current therapy #2  Defer full dose anticoagulation #3  Agree withholding diuretics at this time #4  Further recommendations pending nephrology consult  Signed: Lakeeta Dobosz MD,PhD, [facility] 04/21/2015, 9:09 AM

## 2015-04-21 NOTE — Progress Notes (Signed)
ANTIBIOTIC CONSULT NOTE - INITIAL  Pharmacy Consult for Vancomycin Indication: cellulitis  Allergies  Allergen Reactions  . Codeine Nausea Only  . Penicillins Swelling  . Tetanus Toxoid Hives    Patient Measurements: Height: 5\' 10"  (177.8 cm) Weight: 166 lb 14.4 oz (75.705 kg) IBW/kg (Calculated) : 73  Vital Signs: Temp: 97.5 F (36.4 C) (05/10 0849) Temp Source: Oral (05/10 0849) BP: 87/60 mmHg (05/10 0850) Pulse Rate: 107 (05/10 0850) Intake/Output from previous day: 05/09 0701 - 05/10 0700 In: -  Out: 300 [Urine:300] Intake/Output from this shift:    Labs:  Recent Labs  04/20/15 1957 04/21/15 0322 04/21/15 0327  WBC 10.9* 9.5  --   HGB 11.0* 10.2*  --   PLT 160 156  --   CREATININE 6.55* 6.61* 6.85*   Estimated Creatinine Clearance: 7.5 mL/min (by C-G formula based on Cr of 6.85). No results for input(s): VANCOTROUGH, VANCOPEAK, VANCORANDOM, GENTTROUGH, GENTPEAK, GENTRANDOM, TOBRATROUGH, TOBRAPEAK, TOBRARND, AMIKACINPEAK, AMIKACINTROU, AMIKACIN in the last 72 hours.   Microbiology: No results found for this or any previous visit (from the past 720 hour(s)).  Medical History: Past Medical History  Diagnosis Date  . Blood transfusion without reported diagnosis   . Congestive heart failure, unspecified   . Glaucoma   . Gout   . Hypertension 1970  . Arthritis 2012  . History of radiation therapy 1995    for prostate cancer  . Cancer 1995    prostate  . Skin cancer   . Atrial fibrillation   . COPD (chronic obstructive pulmonary disease)     Medications:  Anti-infectives    Start     Dose/Rate Route Frequency Ordered Stop   04/24/15 1100  vancomycin (VANCOCIN) IVPB 1000 mg/200 mL premix     1,000 mg 200 mL/hr over 60 Minutes Intravenous every 72 hours 04/21/15 1122     04/21/15 1100  vancomycin (VANCOCIN) IVPB 1000 mg/200 mL premix     1,000 mg 200 mL/hr over 60 Minutes Intravenous  Once 04/21/15 1054       Assessment: Patient admitted for  acute renal failure. Vancomycin started for cellulitis, patient with elevated HR as well as low BP so will aim for higher goal trough levels.   Goal of Therapy:  Vancomycin trough level 15-20 mcg/ml Watch SCr closely  Plan:  Follow up culture results first trough level will not be at steady state  Paulina Fusi, PharmD, BCPS 04/21/2015 11:39 AM

## 2015-04-21 NOTE — Progress Notes (Signed)
Visit made. Patient is currently followed by [facility] with a Dx: of Chronic systolic heart failure, he is a no code in the home. Philip Morris was admitted to [facility] through the ED for treatment of  Weakness/acute kidney injury. ED work up significant for dehydration and low BP. Patient is currently receiving IVF.  Patient seen lying in bed, son Philip Morris. Present, Attending Physician Dr. Volanda Napoleon also present. [name] appeared sleepy, able to answer simple questions. Philip Saba. reports pateint did eat some bites of breakfast. Patient c/o pain to the back of his right thigh, area appears inflamed, swollen and is tender to touch. Dr. Volanda Napoleon discussed patient's current condition with he and his son. Current plan is to continue hydration with IVF and treat him for possible cellulitis. Patient resting with eyes closed at end of visit. Philip Saba. advised Probation officer that he planned to go home to meet [facility] for delivery of the hospital bed as previously planned. Will continue to follow. Hospice team updated. Thank you  Flo Shanks RN, BSN, [facility] [phone] c

## 2015-04-21 NOTE — Progress Notes (Signed)
PT Cancellation Note  Patient Details Name: RAJA LISKA MRN: [id number] DOB: 01-29-1924   Cancelled Treatment:    Reason Eval/Treat Not Completed: Medical issues which prohibited therapy (further details noted in cancellation note) Upon entering room, pt reports he was indep previously until recent fall. He currently lives alone, however has son who is available to live with him now. Pt agreeable to ambulate to recliner in room, however when OOB attempted, RN entered and stated pt not able to get OOB as she had difficulty with pain control and was about to administer morphine. Unable to complete further evaluation. RN requested therapy return at another date. Will re-attempt next available date.  Greggory Stallion, PT, DPT [phone]    Anniemae Haberkorn 04/21/2015, 3:41 PM

## 2015-04-21 NOTE — Progress Notes (Signed)
Dr. Lavetta Nielsen called this nurse stated that the patients blood pressure is chronic low.   Jeanie Sewer RN

## 2015-04-21 NOTE — Progress Notes (Signed)
Pt has remained alert and oriented this shift. Iv infusing without difficulty. Pt did have a coupling spell that was relieved with coup medication. remaining on 2L of oxygen this shift. Has a chronic low blood pressure, md to be notified if blood pressure is below 70 systolic.

## 2015-04-21 NOTE — ED Notes (Signed)
Floor declined pt d/t BP <42 systolic, Admitting provider called

## 2015-04-21 NOTE — ED Notes (Addendum)
Report called to [facility], RN, will give Heparin and awaiting transport

## 2015-04-21 NOTE — Progress Notes (Signed)
RD Assessment  Admitted with: general weakness PMHx: CHF, HTN, Ca, COPD  Current Diet: 2gm na Typical Food/ Fluid Intake: No intake recorded Meal/ Snack Patterns: Patient reports a good appetite of a regular diet with no restrictions prior to admission.   Supplements: Ensure, not every day as reported per patient  Food Allergies: NKFA Food Preferences: Reviewed  Ht: 70" Current weight: 166# BMI: 25.3 Weight Changes: Patient reports a UBW of 165#. Current weight is 100% of current body weight. Also reviewed old hospital records. Weight in Oct 2015 was 177.9#. Some weight loss compared to old records, but not significant.  UOP: 342ml/ admission Digestive: Last Bm- 5/9 Gastrointestinal: reviewed Skin: no concerns Physical Findings: 2L [place]  Labs:  Electrolyte and Renal Profile:    Recent Labs Lab 04/20/15 1957 04/21/15 0322 04/21/15 0327  BUN 140*  --  149*  CREATININE 6.55* 6.61* 6.85*  NA 134*  --  133*  K 5.7*  --  5.1   Protein Profile:  Recent Labs Lab 04/20/15 1957  ALBUMIN 3.2*    Meds: NS @ 50/ hr, Zocor  PES Statement: No nutrition concerns at this time.  Intervention: 1. Meals and Snacks: Cater to patient preferences   Monitoring/ Evaluation: 1. Energy Intake: goal for patient to meet >90% of estimated needs. 2. Electrolyte and Renal Profile  LOW Care Level  Roda Shutters, RDN Pager: [phone] Office: [phone]

## 2015-04-22 DIAGNOSIS — J449 Chronic obstructive pulmonary disease, unspecified: Secondary | ICD-10-CM

## 2015-04-22 DIAGNOSIS — E785 Hyperlipidemia, unspecified: Secondary | ICD-10-CM

## 2015-04-22 DIAGNOSIS — Z85828 Personal history of other malignant neoplasm of skin: Secondary | ICD-10-CM

## 2015-04-22 DIAGNOSIS — I509 Heart failure, unspecified: Secondary | ICD-10-CM

## 2015-04-22 DIAGNOSIS — I4891 Unspecified atrial fibrillation: Secondary | ICD-10-CM

## 2015-04-22 DIAGNOSIS — Z515 Encounter for palliative care: Secondary | ICD-10-CM

## 2015-04-22 DIAGNOSIS — N179 Acute kidney failure, unspecified: Secondary | ICD-10-CM

## 2015-04-22 DIAGNOSIS — N189 Chronic kidney disease, unspecified: Secondary | ICD-10-CM

## 2015-04-22 DIAGNOSIS — I129 Hypertensive chronic kidney disease with stage 1 through stage 4 chronic kidney disease, or unspecified chronic kidney disease: Secondary | ICD-10-CM

## 2015-04-22 DIAGNOSIS — Z8546 Personal history of malignant neoplasm of prostate: Secondary | ICD-10-CM

## 2015-04-22 LAB — BASIC METABOLIC PANEL
ANION GAP: 13 (ref 5–15)
BUN: 157 mg/dL — ABNORMAL HIGH (ref 6–20)
CALCIUM: 8.6 mg/dL — AB (ref 8.9–10.3)
CO2: 24 mmol/L (ref 22–32)
Chloride: 97 mmol/L — ABNORMAL LOW (ref 101–111)
Creatinine, Ser: 6.64 mg/dL — ABNORMAL HIGH (ref 0.61–1.24)
GFR calc Af Amer: 8 mL/min — ABNORMAL LOW (ref >60)
GFR calc non Af Amer: 7 mL/min — ABNORMAL LOW (ref >60)
Glucose, Bld: 88 mg/dL (ref 65–99)
POTASSIUM: 4.7 mmol/L (ref 3.5–5.1)
Sodium: 134 mmol/L — ABNORMAL LOW (ref 135–145)

## 2015-04-22 LAB — CBC
HEMATOCRIT: 29.1 % — AB (ref 40.0–52.0)
Hemoglobin: 9.8 g/dL — ABNORMAL LOW (ref 13.0–18.0)
MCH: 29.5 pg (ref 26.0–34.0)
MCHC: 33.5 g/dL (ref 32.0–36.0)
MCV: 87.8 fL (ref 80.0–100.0)
Platelets: 142 10*3/uL — ABNORMAL LOW (ref 150–440)
RBC: 3.32 MIL/uL — ABNORMAL LOW (ref 4.40–5.90)
RDW: 18.6 % — AB (ref 11.5–14.5)
WBC: 8.7 10*3/uL (ref 3.8–10.6)

## 2015-04-22 LAB — GLUCOSE, CAPILLARY: GLUCOSE-CAPILLARY: 86 mg/dL (ref 70–99)

## 2015-04-22 MED ORDER — SODIUM CHLORIDE 0.9 % IV SOLN
500.0000 mg | INTRAVENOUS | Status: DC
  Administered 2015-04-22 – 2015-04-24 (×3): 500 mg via INTRAVENOUS
  Filled 2015-04-22 (×6): qty 0.5

## 2015-04-22 MED ORDER — DOBUTAMINE IN D5W 4-5 MG/ML-% IV SOLN
2.5000 ug/kg/min | INTRAVENOUS | Status: DC
  Administered 2015-04-22: 2.5 ug/kg/min via INTRAVENOUS
  Administered 2015-04-24: 5 ug/kg/min via INTRAVENOUS
  Filled 2015-04-22 (×2): qty 250

## 2015-04-22 NOTE — Progress Notes (Signed)
Notified Dr Ola Spurr of low blood pressure of 72/50. Acknowleged. No new  order

## 2015-04-22 NOTE — Progress Notes (Signed)
ANTIBIOTIC CONSULT NOTE - FOLLOW UP  Pharmacy Consult for Vancomycin/Meropenem Indication: rule out sepsis/cellulitis  Allergies  Allergen Reactions  . Codeine Nausea Only  . Penicillins Swelling  . Tetanus Toxoid Hives    Patient Measurements: Height: 5\' 10"  (177.8 cm) Weight: 166 lb 14.4 oz (75.705 kg) IBW/kg (Calculated) : 73 Adjusted Body Weight:   Vital Signs: Temp: 97.2 F (36.2 C) (05/11 0803) Temp Source: Oral (05/11 0357) BP: 72/47 mmHg (05/11 0803) Pulse Rate: 97 (05/11 0803) Intake/Output from previous day: 05/10 0701 - 05/11 0700 In: 780.8 [P.O.:100; I.V.:680.8] Out: 555 [Urine:555] Intake/Output from this shift:    Labs:  Recent Labs  04/20/15 1957 04/21/15 0322 04/21/15 0327 04/22/15 0518  WBC 10.9* 9.5  --  8.7  HGB 11.0* 10.2*  --  9.8*  PLT 160 156  --  142*  CREATININE 6.55* 6.61* 6.85* 6.64*   Estimated Creatinine Clearance: 7.8 mL/min (by C-G formula based on Cr of 6.64).    Microbiology: Recent Results (from the past 720 hour(s))  Culture, blood (routine x 2)     Status: None (Preliminary result)   Collection Time: 04/21/15  3:01 PM  Result Value Ref Range Status   Specimen Description BLOOD  Final   Special Requests Normal  Final   Culture  Setup Time   Final    BLOOD ANAEROBIC BOTTLE GRAM POSITIVE COCCI IN CHAINS CRITICAL RESULT CALLED TO, READ BACK BY AND VERIFIED WITH: JADIE COHEN 04/22/15 DV    Culture   Final    GRAM POSITIVE COCCI ANAEROBIC BOTTLE ONLY IDENTIFICATION TO FOLLOW    Report Status PENDING  Incomplete  Culture, blood (routine x 2)     Status: None (Preliminary result)   Collection Time: 04/21/15  3:01 PM  Result Value Ref Range Status   Specimen Description BLOOD  Final   Special Requests Normal  Final   Culture  Setup Time   Final    BLOOD ANAEROBIC BOTTLE GRAM POSITIVE COCCI IN CHAINS CRITICAL RESULT CALLED TO, READ BACK BY AND VERIFIED WITH: JADIE COHEN AT 0093 04/22/15 DV    Culture   Final    GRAM  POSITIVE COCCI ANAEROBIC BOTTLE ONLY IDENTIFICATION TO FOLLOW    Report Status PENDING  Incomplete    Anti-infectives    Start     Dose/Rate Route Frequency Ordered Stop   04/24/15 1100  vancomycin (VANCOCIN) IVPB 1000 mg/200 mL premix     1,000 mg 200 mL/hr over 60 Minutes Intravenous every 72 hours 04/21/15 1122     04/22/15 1000  meropenem (MERREM) 500 mg in sodium chloride 0.9 % 50 mL IVPB     500 mg 100 mL/hr over 30 Minutes Intravenous Every 24 hours 04/22/15 0845     04/21/15 1100  vancomycin (VANCOCIN) IVPB 1000 mg/200 mL premix     1,000 mg 200 mL/hr over 60 Minutes Intravenous  Once 04/21/15 1054 04/21/15 1759     Assessment: Patient admitted for acute renal failure. Vancomycin started for cellulitis, patient with elevated HR as well as low BP so will aim for higher goal trough levels.   5/11: Meropenem added. Per Nephrology note: patient not a good candidate for Hemodialysis-   Goal of Therapy:  Vancomycin trough level 15-20 mcg/ml Watch SCr closely  Plan:  Patient on Vancomycin 1 gram IV Q72h. Will order Meropenem 500mg  IV Q24h for Crcl < 10 ml/min. Follow up culture results first trough level 5/16 at 1030 will not be at steady state  [employer]  PharmD Clinical Pharmacist 04/22/2015

## 2015-04-22 NOTE — Progress Notes (Addendum)
Patient being transferred to ICU per Dr. Volanda Napoleon.  Pt needs dobutamine drip. Called report to [facility] in ICU.

## 2015-04-22 NOTE — Progress Notes (Signed)
Subjective:   Discussed with Dr Volanda Napoleon. Patient transferred to ICU for dobutamine infusion His UOP appears to have improved significantly S Cr/BUN remains critically high  Objective:  Vital signs in last 24 hours:  Temp:  [97.2 F (36.2 C)-97.8 F (36.6 C)] 97.7 F (36.5 C) (05/11 1220) Pulse Rate:  [38-104] 104 (05/11 1600) Resp:  [16-20] 16 (05/11 1600) BP: (72-115)/(47-95) 102/67 mmHg (05/11 1600) SpO2:  [91 %-100 %] 96 % (05/11 1600)  Weight change:  Filed Weights   04/20/15 2003 04/21/15 0157  Weight: 79.833 kg (176 lb) 75.705 kg (166 lb 14.4 oz)    Intake/Output: I/O last 3 completed shifts: In: 780.8 [P.O.:100; I.V.:680.8] Out: 855 [Urine:855]   Intake/Output this shift:  Total I/O In: 217.1 [I.V.:117.1; IV Piggyback:100] Out: 250 [Urine:250]  Physical Exam: General: NAD, ill appearing  Head: Normocephalic, atraumatic. dry oral mucosal membranes  Eyes: Anicteric,   Neck: Supple, trachea midline  Lungs:  Clear to auscultation  Heart: Regular rate and rhythm  Abdomen:  Soft, nontender, distended, Ascites   Extremities:  1+ peripheral edema.  Neurologic: Nonfocal, moving all four extremities  Skin: No lesions  Access:     Basic Metabolic Panel:  Recent Labs Lab 04/20/15 1957 04/21/15 0322 04/21/15 0327 04/22/15 0518  NA 134*  --  133* 134*  K 5.7*  --  5.1 4.7  CL 95*  --  95* 97*  CO2 21*  --  21* 24  GLUCOSE 122*  --  85 88  BUN 140*  --  149* 157*  CREATININE 6.55* 6.61* 6.85* 6.64*  CALCIUM 9.2  --  9.2 8.6*    Liver Function Tests:  Recent Labs Lab 04/20/15 1957  AST 53*  ALT 25  ALKPHOS 98  BILITOT 1.8*  PROT 6.3*  ALBUMIN 3.2*   No results for input(s): LIPASE, AMYLASE in the last 168 hours. No results for input(s): AMMONIA in the last 168 hours.  CBC:  Recent Labs Lab 04/20/15 1957 04/21/15 0322 04/22/15 0518  WBC 10.9* 9.5 8.7  HGB 11.0* 10.2* 9.8*  HCT 33.4* 31.6* 29.1*  MCV 88.2 88.4 87.8  PLT 160 156 142*     Cardiac Enzymes:  Recent Labs Lab 04/20/15 1957 04/21/15 0322 04/21/15 0925 04/21/15 1501  TROPONINI 0.07* 0.07* 0.06* 0.05*    BNP: Invalid input(s): POCBNP  CBG:  Recent Labs Lab 04/22/15 1213  GLUCAP 54    Microbiology: Results for orders placed or performed during the hospital encounter of 04/20/15  Culture, blood (routine x 2)     Status: None (Preliminary result)   Collection Time: 04/21/15  3:01 PM  Result Value Ref Range Status   Specimen Description BLOOD  Final   Special Requests Normal  Final   Culture  Setup Time   Final    GRAM POSITIVE COCCI IN CHAINS IN BOTH AEROBIC AND ANAEROBIC BOTTLES CRITICAL RESULT CALLED TO, READ BACK BY AND VERIFIED WITH: JADIE COHEN 04/22/15 DV    Culture   Final    GRAM POSITIVE COCCI IN BOTH AEROBIC AND ANAEROBIC BOTTLES IDENTIFICATION TO FOLLOW    Report Status PENDING  Incomplete  Culture, blood (routine x 2)     Status: None (Preliminary result)   Collection Time: 04/21/15  3:01 PM  Result Value Ref Range Status   Specimen Description BLOOD  Final   Special Requests Normal  Final   Culture  Setup Time   Final    BLOOD ANAEROBIC BOTTLE GRAM POSITIVE COCCI IN CHAINS CRITICAL RESULT  CALLED TO, READ BACK BY AND VERIFIED WITH: JADIE COHEN AT 7001 04/22/15 DV    Culture   Final    GRAM POSITIVE COCCI ANAEROBIC BOTTLE ONLY IDENTIFICATION TO FOLLOW    Report Status PENDING  Incomplete    Coagulation Studies: No results for input(s): LABPROT, INR in the last 72 hours.  Urinalysis:  Recent Labs  04/21/15 0645  COLORURINE YELLOW*  LABSPEC 1.011  PHURINE 5.0  GLUCOSEU NEGATIVE  HGBUR NEGATIVE  BILIRUBINUR NEGATIVE  KETONESUR NEGATIVE  PROTEINUR NEGATIVE  NITRITE NEGATIVE  LEUKOCYTESUR NEGATIVE      Imaging: Ct Head Wo Contrast  04/20/2015   CLINICAL DATA:  The patient fell when walking from the bathroom to the living room in his home and hit the back of his head on the wall. No symptoms currently  reported.  EXAM: CT HEAD WITHOUT CONTRAST  TECHNIQUE: Contiguous axial images were obtained from the base of the skull through the vertex without intravenous contrast.  COMPARISON:  Brain MR dated 09/18/2009.  FINDINGS: Diffusely enlarged ventricles and subarachnoid spaces. Patchy white matter low density in both cerebral hemispheres. No skull fracture, intracranial hemorrhage or paranasal sinus air-fluid levels.  IMPRESSION: 1. No skull fracture or intracranial hemorrhage. 2. Mildly progressive atrophy and chronic small vessel white matter ischemic changes.   Electronically Signed   By: Claudie Revering M.D.   On: 04/20/2015 21:31   Dg Chest Portable 1 View  04/20/2015   CLINICAL DATA:  Weakness for several days.  Some coughing.  EXAM: PORTABLE CHEST - 1 VIEW  COMPARISON:  04/07/2014.  FINDINGS: Mildly progressive enlargement of the cardiac silhouette. Breathing motion blurring with no gross change in prominence of the pulmonary vasculature and interstitial markings. No definite pleural fluid. Stable left subclavian AICD lead.  IMPRESSION: Mildly progressive cardiomegaly with grossly stable pulmonary vascular congestion and chronic interstitial lung disease.   Electronically Signed   By: Claudie Revering M.D.   On: 04/20/2015 21:13     Medications:   . sodium chloride 50 mL/hr at 04/22/15 1200  . DOBUTamine 4.932 mcg/kg/min (04/22/15 1400)   . amiodarone  200 mg Oral Daily  . brimonidine  1 drop Both Eyes BID  . budesonide-formoterol  2 puff Inhalation BID  . febuxostat  40 mg Oral Daily  . meropenem (MERREM) IV  500 mg Intravenous Q24H  . simvastatin  40 mg Oral Daily  . sodium chloride  3 mL Intravenous Q12H  . timolol  1 drop Both Eyes BID  . [START ON 04/24/2015] vancomycin  1,000 mg Intravenous Q72H   acetaminophen **OR** [DISCONTINUED] acetaminophen, albuterol, guaiFENesin, morphine injection, [DISCONTINUED] ondansetron **OR** ondansetron (ZOFRAN) IV  Assessment/ Plan:  Pt is a 81 y.o. yo male  with a PMHX of CHF ef 10 %, AICD, was admitted to [facility] on 04/20/2015 with weakness  1. ARF. Oliguric with critically elevated BUN and Cr Seems to have a good response to iv dobutamine Tolerating well May continue low dose iv fluids as PO intake is poor Due to severe cardiac dysfunction, will make a poor candidate for chronic dialysis   2. CKD st 4 - Baseline Cr 2.92/GFR 22 back in October 2015  3. Hyperkalemia - improved - low K diet   LOS: 2 Oluwasemilore Pascuzzi 5/11/20166:06 PM

## 2015-04-22 NOTE — Progress Notes (Addendum)
Physical Therapy Discharge Patient Details Name: Philip Morris MRN: [id number] DOB: [birth year] Today's Date: 04/22/2015 Time:  -     Patient with transfer to CCU and now on cardiac drip. Not appropriate for PT at this time. Noted MD ordered continued PT while in CCU. Will re-assess next date if medically stable.  Please see latest therapy progress note for current level of functioning and progress toward goals.    Progress and discharge plan discussed with patient and/or caregiver: per RN      Greggory Stallion, PT, DPT [phone]  Jessicca Stitzer 04/22/2015, 11:23 AM

## 2015-04-22 NOTE — Consult Note (Signed)
Palliative Medicine Inpatient Consult Note   Name: Philip Morris Date: 04/22/2015 MRN: [id number]  DOB: 1924/07/13  Referring Physician: Aldean Jewett, MD  Palliative Care consult requested for this 81 y.o. male for goals of medical therapy in patient with NICM s/p ICD, CKD, admitted with A/CKD  Mr Boer is an 81 yo man with PMH of NICM with EF 10% s/p AICD, CKD, HTN, hyperlipidemia, COPD, a.fib, OA, glaucoma, gout, prostate cancer s/p sgy/XRT. He was admitted 04/20/15 with weakness and decreased po. Work-up significant for oliguric A/CKD with hyperkalemia. This AM, pt was transferred to CCU for dobutamine drip to see if renal function might be improved. At present, pt is lying in bed. Awake, oriented. Family at bedside.    REVIEW OF SYSTEMS:  Pain: None Dyspnea:  Yes Nausea/Vomiting:  No Diarrhea:  No Constipation:   No Depression:   No Anxiety:   No Fatigue:   Yes  SOCIAL HISTORY: Pt is widowed. He lives at home and his son lives with him. He is followed at home by hospice. 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 Pt's HCPOA is daughter, Opal Sidles, who lives in [place].  reports that he has never smoked. He has never used smokeless tobacco. He reports that he drinks alcohol. He reports that he does not use illicit drugs.  CODE STATUS: DNR  PAST MEDICAL HISTORY: Past Medical History  Diagnosis Date  . Blood transfusion without reported diagnosis   . Congestive heart failure, unspecified   . Glaucoma   . Gout   . Hypertension 1970  . Arthritis 2012  . History of radiation therapy 1995    for prostate cancer  . Cancer 1995    prostate  . Skin cancer   . Atrial fibrillation   . COPD (chronic obstructive pulmonary disease)     PAST SURGICAL HISTORY:  Past Surgical History  Procedure Laterality Date  . Prostate surgery  2002  . Colonoscopy  2006  . Cardiac defibrillator placement  2014    ALLERGIES:  is allergic to codeine; penicillins; and tetanus  toxoid.  MEDICATIONS:  Current Facility-Administered Medications  Medication Dose Route Frequency Provider Last Rate Last Dose  . 0.9 %  sodium chloride infusion   Intravenous Continuous Lytle Butte, MD 50 mL/hr at 04/22/15 0203    . acetaminophen (TYLENOL) tablet 650 mg  650 mg Oral Q6H PRN Lytle Butte, MD   650 mg at 04/21/15 2057  . albuterol (PROVENTIL) (2.5 MG/3ML) 0.083% nebulizer solution 2.5 mg  2.5 mg Nebulization Q6H PRN Lytle Butte, MD      . amiodarone (PACERONE) tablet 200 mg  200 mg Oral Daily Lytle Butte, MD   200 mg at 04/22/15 0917  . brimonidine (ALPHAGAN) 0.2 % ophthalmic solution 1 drop  1 drop Both Eyes BID Lytle Butte, MD   1 drop at 04/22/15 0916  . budesonide-formoterol (SYMBICORT) 80-4.5 MCG/ACT inhaler 2 puff  2 puff Inhalation BID Lytle Butte, MD   2 puff at 04/22/15 [phone]  . DOBUTamine (DOBUTREX) infusion 4000 mcg/mL  2.5-20 mcg/kg/min Intravenous Titrated Aldean Jewett, MD 2.8 mL/hr at 04/22/15 1216 2.5 mcg/kg/min at 04/22/15 1216  . febuxostat (ULORIC) tablet 40 mg  40 mg Oral Daily Lytle Butte, MD   40 mg at 04/22/15 0917  . guaiFENesin (ROBITUSSIN) 100 MG/5ML solution 100 mg  5 mL Oral Q4H PRN Lytle Butte, MD   100 mg at 04/21/15 0310  . meropenem (  MERREM) 500 mg in sodium chloride 0.9 % 50 mL IVPB  500 mg Intravenous Q24H Aldean Jewett, MD   500 mg at 04/22/15 1015  . morphine 2 MG/ML injection 1 mg  1 mg Intravenous Q4H PRN Murlean Iba, MD   1 mg at 04/22/15 0338  . ondansetron (ZOFRAN) injection 4 mg  4 mg Intravenous Q6H PRN Lytle Butte, MD      . simvastatin (ZOCOR) tablet 40 mg  40 mg Oral Daily Lytle Butte, MD   40 mg at 04/22/15 0917  . sodium chloride 0.9 % injection 3 mL  3 mL Intravenous Q12H Lytle Butte, MD   3 mL at 04/22/15 0917  . timolol (TIMOPTIC) 0.5 % ophthalmic solution 1 drop  1 drop Both Eyes BID Lytle Butte, MD   1 drop at 04/22/15 0916  . [START ON 04/24/2015] vancomycin (VANCOCIN) IVPB 1000 mg/200 mL premix   1,000 mg Intravenous Q72H Vira Blanco, [facility]        Vital Signs: BP 89/63 mmHg  Pulse 102  Temp(Src) 97.7 F (36.5 C) (Oral)  Resp 16  Ht _0  (1.778 m)  Wt 75.705 kg (166 lb 14.4 oz)  BMI 23.95 kg/m2  SpO2 98% Filed Weights   04/20/15 2003 04/21/15 0157  Weight: 79.833 kg (176 lb) 75.705 kg (166 lb 14.4 oz)    Estimated body mass index is 23.95 kg/(m^2) as calculated from the following:   Height as of this encounter: _1  (1.778 m).   Weight as of this encounter: 75.705 kg (166 lb 14.4 oz).  PERFORMANCE STATUS (ECOG) : 3 - Symptomatic, >50% confined to bed  PHYSICAL EXAM:  General: ill appearing HEENT: OP clear, hearing intact Neck: Trachea midline  Cardiovascular: regular rate and rhythm Pulmonary/Chest: clear ant fields Abdominal: Soft, nontender, hypoactive bowel sounds GU: no suprapubic tenderness Extremities: + edema BLE's Neurological: grossly nonfocal Skin: RLE cellulitis Psychiatric: alert, oriented   LABS: CBC:  Recent Labs Lab 04/21/15 0322 04/22/15 0518  WBC 9.5 8.7  HGB 10.2* 9.8*  HCT 31.6* 29.1*  PLT 156 142*   Comprehensive Metabolic Panel:  Recent Labs Lab 04/20/15 1957  04/21/15 0327 04/22/15 0518  NA 134*  --  133* 134*  K 5.7*  --  5.1 4.7  CL 95*  --  95* 97*  CO2 21*  --  21* 24  GLUCOSE 122*  --  85 88  BUN 140*  --  149* 157*  CREATININE 6.55*  < > 6.85* 6.64*  CALCIUM 9.2  --  9.2 8.6*  AST 53*  --   --   --   ALT 25  --   --   --   ALKPHOS 98  --   --   --   BILITOT 1.8*  --   --   --   < > = values in this interval not displayed.  IMPRESSION:  Mr Younglove is an 81 yo man with PMH of NICM with EF 10% s/p AICD, CKD, HTN, hyperlipidemia, COPD, a.fib, OA, glaucoma, gout, prostate cancer s/p sgy/XRT. He was admitted 04/20/15 with weakness and decreased po. Work-up significant for oliguric A/CKD with hyperkalemia. This AM, pt was transferred to CCU for dobutamine drip to see if renal function might be improved.   I met with  pt's daughter, Opal Sidles, who is pt's HCPOA. Updated her on pt's current condition. She she says that pt has had a decline in functional status prior to this hospitalization.  She understands that pt is critically ill and that he might be approaching end of life. However, daughter will respect whatever pt decides about his care including current plan. Daughter confirms that pt is a DNR.   Daughter expressed appreciation for meeting. All questions answered. Will continue to follow pt throughout hospitalization.      PLAN: As above   More than 50% of the visit was spent in counseling/coordination of care: YES  Time spent: 75 minutes

## 2015-04-22 NOTE — Progress Notes (Signed)
Visit made. Patient seen lying in bed, alert. Patient's daughter Philip Morris, son Philip Morris, and other family members present. Patient reported his pain as "ok", encouraged patient and family to request PRN pain medications for comfort. Foley placed yesterday draining light tea colored urine. Oxygen remains at 2 L via nasal cannula.  Per staff RN Herma Ard and chart notes patient is being transferred to the ICU step down for administration of a dobutamine drip. Patient's blood pressure remains low, 72/47 this am,  Bun and Creatinine remain elevated at 157 and 6.64 respectively. He continues on IV antibiotics for treatment of cellulitis, blood culture resulted today are positive for gram positive cocci. Philip Morris stated that she wants to see if the dobutamine will improve his kidney function, but also appears to realize how ill [name] is. Per chart review a Palliative Care consult is now pending. Patient is a DNR. Will continue to follow.  Flo Shanks RN, BSN, [place] [facility] [place], [facility] [phone] c

## 2015-04-22 NOTE — Progress Notes (Signed)
[facility] Cardiology  SUBJECTIVE: Denies chest pain or shortness of breath. Patient does experience generalized weakness and lack of appetite.   Filed Vitals:   04/22/15 0008 04/22/15 0357 04/22/15 0401 04/22/15 0803  BP: 74/51 77/56 83/56  72/47  Pulse:  80 89 97  Temp:  97.2 F (36.2 C)  97.2 F (36.2 C)  TempSrc:  Oral    Resp:  18 18 18   Height:      Weight:      SpO2:  100%  100%     Intake/Output Summary (Last 24 hours) at 04/22/15 0849 Last data filed at 04/22/15 0317  Gross per 24 hour  Intake 780.83 ml  Output    555 ml  Net 225.83 ml      PHYSICAL EXAM  General: In mild distress HEENT:  Normocephalic and atramatic Neck:  No JVD.  Lungs: Clear bilaterally to auscultation and percussion. Heart: HRRR . Normal S1 and S2 without gallops or murmurs.  Abdomen: Mildly distended Msk:  Back normal, normal gait. Normal strength and tone for age. Extremities: Redness observed upper right lower extremity Neuro: Alert and oriented X 3. Psych:  Good affect, responds appropriately   LABS: Basic Metabolic Panel:  Recent Labs  04/21/15 0327 04/22/15 0518  NA 133* 134*  K 5.1 4.7  CL 95* 97*  CO2 21* 24  GLUCOSE 85 88  BUN 149* 157*  CREATININE 6.85* 6.64*  CALCIUM 9.2 8.6*   Liver Function Tests:  Recent Labs  04/20/15 1957  AST 53*  ALT 25  ALKPHOS 98  BILITOT 1.8*  PROT 6.3*  ALBUMIN 3.2*   No results for input(s): LIPASE, AMYLASE in the last 72 hours. CBC:  Recent Labs  04/21/15 0322 04/22/15 0518  WBC 9.5 8.7  HGB 10.2* 9.8*  HCT 31.6* 29.1*  MCV 88.4 87.8  PLT 156 142*   Cardiac Enzymes:  Recent Labs  04/21/15 0322 04/21/15 0925 04/21/15 1501  TROPONINI 0.07* 0.06* 0.05*   BNP: Invalid input(s): POCBNP D-Dimer: No results for input(s): DDIMER in the last 72 hours. Hemoglobin A1C: No results for input(s): HGBA1C in the last 72 hours. Fasting Lipid Panel: No results for input(s): CHOL, HDL, LDLCALC, TRIG, CHOLHDL, LDLDIRECT in  the last 72 hours. Thyroid Function Tests: No results for input(s): TSH, T4TOTAL, T3FREE, THYROIDAB in the last 72 hours.  Invalid input(s): FREET3 Anemia Panel: No results for input(s): VITAMINB12, FOLATE, FERRITIN, TIBC, IRON, RETICCTPCT in the last 72 hours.  Ct Head Wo Contrast  04/20/2015   CLINICAL DATA:  The patient fell when walking from the bathroom to the living room in his home and hit the back of his head on the wall. No symptoms currently reported.  EXAM: CT HEAD WITHOUT CONTRAST  TECHNIQUE: Contiguous axial images were obtained from the base of the skull through the vertex without intravenous contrast.  COMPARISON:  Brain MR dated 09/18/2009.  FINDINGS: Diffusely enlarged ventricles and subarachnoid spaces. Patchy white matter low density in both cerebral hemispheres. No skull fracture, intracranial hemorrhage or paranasal sinus air-fluid levels.  IMPRESSION: 1. No skull fracture or intracranial hemorrhage. 2. Mildly progressive atrophy and chronic small vessel white matter ischemic changes.   Electronically Signed   By: Claudie Revering M.D.   On: 04/20/2015 21:31   Dg Chest Portable 1 View  04/20/2015   CLINICAL DATA:  Weakness for several days.  Some coughing.  EXAM: PORTABLE CHEST - 1 VIEW  COMPARISON:  04/07/2014.  FINDINGS: Mildly progressive enlargement of the cardiac silhouette.  Breathing motion blurring with no gross change in prominence of the pulmonary vasculature and interstitial markings. No definite pleural fluid. Stable left subclavian AICD lead.  IMPRESSION: Mildly progressive cardiomegaly with grossly stable pulmonary vascular congestion and chronic interstitial lung disease.   Electronically Signed   By: Claudie Revering M.D.   On: 04/20/2015 21:13     Echo not done this hospitalization  TELEMETRY: Normal sinus rhythm:  ASSESSMENT AND PLAN:  Principal Problem:   Acute-on-chronic kidney injury Active Problems:   Hyperkalemia   Systolic heart failure, chronic    Hypertension, essential   Hyperlipidemia   Muscular deconditioning   Chronic systolic congestive heart failure   Atrial fibrillation    81 year old gentleman with known nonischemic dilated cardiomyopathy, chronic systolic congestive heart failure, status post ICD who presents with chief complaint of weakness, decreased by mouth intake, noted to have oliguria with acute renal failure with markedly elevated BUN and creatinine. Following admission, patient noted to have mellitus right lower extremity with positive blood cultures consistent with cellulitis and sepsis.  REC  #1 agree with current therapy #2 hold diuretic therapy for now #3 gentle IV hydration #4 continue IV vancomycin, monitor levels closely #5 chronic dialysis likely not an option in light of patient's severe cardiac comorbidities   Leani Myron, MD, PhD, [facility] 04/22/2015 8:49 AM

## 2015-04-22 NOTE — Progress Notes (Addendum)
[place] at [place] NAME: Philip Morris    MR#:  [id number]  DATE OF BIRTH:  01-23-1924  SUBJECTIVE:  Significant decline overnight. Now hypotensive and much weaker.   REVIEW OF SYSTEMS:  Review of Systems  Constitutional: Positive for malaise/fatigue. Negative for fever.  Respiratory: Negative for shortness of breath.   Cardiovascular: Positive for leg swelling. Negative for chest pain and palpitations.  Gastrointestinal: Negative for heartburn and abdominal pain.  Genitourinary: Negative for dysuria.  Skin: Positive for rash.       Inner right thigh  Neurological: Positive for dizziness and weakness.     DRUG ALLERGIES:   Allergies  Allergen Reactions  . Codeine Nausea Only  . Penicillins Swelling  . Tetanus Toxoid Hives    VITALS:  Blood pressure 72/47, pulse 97, temperature 97.2 F (36.2 C), temperature source Oral, resp. rate 18, height 5\' 10"  (1.778 m), weight 75.705 kg (166 lb 14.4 oz), SpO2 100 %.  PHYSICAL EXAMINATION:  Physical Exam  Constitutional: He is oriented to person, place, and time.  Very weak, falling asleep between sentences  HENT:  Head: Normocephalic and atraumatic.  Eyes: EOM are normal. Pupils are equal, round, and reactive to light.  Pinpoint pupils  Neck: Normal range of motion.  Cardiovascular: Normal rate, regular rhythm and intact distal pulses.   Murmur heard. Pulmonary/Chest: Effort normal and breath sounds normal.  Abdominal: Soft. Bowel sounds are normal.  Lymphadenopathy:    He has no cervical adenopathy.  Neurological: He is oriented to person, place, and time.  Skin: Skin is warm and dry.  Bruising over abdomen, right upper thigh, erythema/induration/warmth and tenderness on the right posterior upper/inner thigh  Psychiatric: He has a normal mood and affect.     LABORATORY PANEL:   CBC  Recent Labs Lab 04/22/15 0518  WBC 8.7  HGB 9.8*  HCT 29.1*  PLT 142*    ------------------------------------------------------------------------------------------------------------------  Chemistries   Recent Labs Lab 04/20/15 1957  04/22/15 0518  NA 134*  < > 134*  K 5.7*  < > 4.7  CL 95*  < > 97*  CO2 21*  < > 24  GLUCOSE 122*  < > 88  BUN 140*  < > 157*  CREATININE 6.55*  < > 6.64*  CALCIUM 9.2  < > 8.6*  AST 53*  --   --   ALT 25  --   --   ALKPHOS 98  --   --   BILITOT 1.8*  --   --   < > = values in this interval not displayed. ------------------------------------------------------------------------------------------------------------------  Cardiac Enzymes  Recent Labs Lab 04/21/15 1501  TROPONINI 0.05*   ------------------------------------------------------------------------------------------------------------------  RADIOLOGY:  Ct Head Wo Contrast  04/20/2015   CLINICAL DATA:  The patient fell when walking from the bathroom to the living room in his home and hit the back of his head on the wall. No symptoms currently reported.  EXAM: CT HEAD WITHOUT CONTRAST  TECHNIQUE: Contiguous axial images were obtained from the base of the skull through the vertex without intravenous contrast.  COMPARISON:  Brain MR dated 09/18/2009.  FINDINGS: Diffusely enlarged ventricles and subarachnoid spaces. Patchy white matter low density in both cerebral hemispheres. No skull fracture, intracranial hemorrhage or paranasal sinus air-fluid levels.  IMPRESSION: 1. No skull fracture or intracranial hemorrhage. 2. Mildly progressive atrophy and chronic small vessel white matter ischemic changes.   Electronically Signed   By: Percell Locus.D.  On: 04/20/2015 21:31   Dg Chest Portable 1 View  04/20/2015   CLINICAL DATA:  Weakness for several days.  Some coughing.  EXAM: PORTABLE CHEST - 1 VIEW  COMPARISON:  04/07/2014.  FINDINGS: Mildly progressive enlargement of the cardiac silhouette. Breathing motion blurring with no gross change in prominence of the pulmonary  vasculature and interstitial markings. No definite pleural fluid. Stable left subclavian AICD lead.  IMPRESSION: Mildly progressive cardiomegaly with grossly stable pulmonary vascular congestion and chronic interstitial lung disease.   Electronically Signed   By: Claudie Revering M.D.   On: 04/20/2015 21:13    EKG:   Orders placed or performed during the hospital encounter of 04/20/15  . ED EKG  . ED EKG  . EKG 12-Lead  . EKG 12-Lead  . EKG    ASSESSMENT AND PLAN:   Principal Problem:   Acute-on-chronic kidney injury Active Problems:   Hyperkalemia   Systolic heart failure, chronic   Hypertension, essential   Hyperlipidemia   Muscular deconditioning   Chronic systolic congestive heart failure   Atrial fibrillation   #1 sepsis: Blood cultures positive for gram-positive cocci. Likely source is cellulitis. He's hypotensive and fatigued this morning. Complicated as we cannot give fluids due to decreased ejection fraction. He is on hospice as an outpatient. I have discussed when of care with the family and they are in favor of attempting a dobutamine drip to increase pressures and see if renal function can improve at all. We'll transfer him to the stepdown unit. Continue vancomycin, add meropenem as he is allergic to penicillins. Await further culture results.   #2. Acute kidney injury on chronic kidney disease: Severe renal failure with GFR of 7, creatinine stable at 6.6 - appreciate nephrology consultation - continue gentle IVF - hyperkalemia improved - not a good dialysis candidate due to advanced heart failure  #3. Hyperkalemia: improved - received calcium, insulin, dextrose in emergency room.  #4 Elevated troponin:  - No NSTEMI, combination of strain and renal failure  #5 Systolic heart failure, chronic:  - Will continue to hold diuretics given above symptoms  #6 Chronic atrial fibrillation:  - rate controlled - Continue eliquis  #7 goals of care: Patient remains a DO NOT  RESUSCITATE. He is a hospice patient is an outpatient. No heroic measures. We'll consult palliative care for continued end-of-life discussions. At this point we are not transitioning to comfort care, will continue with plan as described above. However, if renal function does not improve it is likely that we will need to transition.  All the records are reviewed and case discussed with Care Management/Social Worker  Management plans discussed with the patient, family and they are in agreement.  CODE STATUS: DNR  TOTAL TIME TAKING CARE OF THIS PATIENT: 35 minutes.   Myrtis Ser M.D on 04/22/2015 at 11:46 AM  Between 7am to 6pm - Pager - [phone]  After 6pm go to [email or web address] - password [email or web address] [facility]  Office  [phone]  CC: Primary care physician; Miguel Aschoff, MD

## 2015-04-23 DIAGNOSIS — R112 Nausea with vomiting, unspecified: Secondary | ICD-10-CM

## 2015-04-23 LAB — BASIC METABOLIC PANEL
Anion gap: 14 (ref 5–15)
BUN: 152 mg/dL — ABNORMAL HIGH (ref 6–20)
CALCIUM: 8.7 mg/dL — AB (ref 8.9–10.3)
CO2: 24 mmol/L (ref 22–32)
Chloride: 99 mmol/L — ABNORMAL LOW (ref 101–111)
Creatinine, Ser: 6.32 mg/dL — ABNORMAL HIGH (ref 0.61–1.24)
GFR calc Af Amer: 8 mL/min — ABNORMAL LOW (ref >60)
GFR, EST NON AFRICAN AMERICAN: 7 mL/min — AB (ref >60)
Glucose, Bld: 83 mg/dL (ref 65–99)
Potassium: 4.3 mmol/L (ref 3.5–5.1)
SODIUM: 137 mmol/L (ref 135–145)

## 2015-04-23 LAB — CBC
HCT: 29.1 % — ABNORMAL LOW (ref 40.0–52.0)
HEMOGLOBIN: 9.7 g/dL — AB (ref 13.0–18.0)
MCH: 29.6 pg (ref 26.0–34.0)
MCHC: 33.4 g/dL (ref 32.0–36.0)
MCV: 88.7 fL (ref 80.0–100.0)
Platelets: 168 10*3/uL (ref 150–440)
RBC: 3.29 MIL/uL — ABNORMAL LOW (ref 4.40–5.90)
RDW: 18.5 % — AB (ref 11.5–14.5)
WBC: 8.2 10*3/uL (ref 3.8–10.6)

## 2015-04-23 MED ORDER — TRAMADOL HCL 50 MG PO TABS
50.0000 mg | ORAL_TABLET | Freq: Four times a day (QID) | ORAL | Status: DC | PRN
  Administered 2015-04-23 – 2015-04-24 (×2): 50 mg via ORAL
  Filled 2015-04-23 (×2): qty 1

## 2015-04-23 MED ORDER — ONDANSETRON HCL 4 MG/2ML IJ SOLN
4.0000 mg | INTRAMUSCULAR | Status: DC | PRN
  Administered 2015-04-24: 4 mg via INTRAVENOUS
  Filled 2015-04-23: qty 2

## 2015-04-23 MED ORDER — NEPRO/CARBSTEADY PO LIQD
237.0000 mL | Freq: Two times a day (BID) | ORAL | Status: DC
  Administered 2015-04-23 – 2015-04-24 (×2): 237 mL via ORAL

## 2015-04-23 MED ORDER — PROCHLORPERAZINE EDISYLATE 5 MG/ML IJ SOLN
10.0000 mg | Freq: Four times a day (QID) | INTRAMUSCULAR | Status: DC | PRN
  Administered 2015-04-23 – 2015-04-24 (×2): 10 mg via INTRAVENOUS
  Filled 2015-04-23 (×4): qty 2

## 2015-04-23 MED ORDER — MORPHINE SULFATE 4 MG/ML IJ SOLN
4.0000 mg | INTRAMUSCULAR | Status: DC | PRN
  Administered 2015-04-23: 4 mg via INTRAVENOUS
  Administered 2015-04-23: 2 mg via INTRAVENOUS
  Administered 2015-04-24 (×5): 4 mg via INTRAVENOUS
  Filled 2015-04-23 (×7): qty 1

## 2015-04-23 NOTE — Progress Notes (Signed)
PT Cancellation Note  Patient Details Name: Philip Morris MRN: [id number] DOB: May 01, 1924   Cancelled Treatment:    Reason Eval/Treat Not Completed: Medical issues which prohibited therapy (further details in PT cancellation note). Spoke to [employer]; pt continues to not be appropriate for PT at this time. Pt currently on IV dobuamine at this time for cardiac arrhythmias. Will discontinue PT orders at this time. Please re-consult when medically necessary.  Greggory Stallion, PT, DPT [phone]  Autum Benfer 04/23/2015, 2:10 PM

## 2015-04-23 NOTE — Progress Notes (Signed)
[facility] Cardiology  SUBJECTIVE: Denies CP or SOB   Filed Vitals:   04/23/15 0400 04/23/15 0500 04/23/15 0559 04/23/15 0600  BP: 88/67 89/68  85/68  Pulse: 105 95  109  Temp: 98.5 F (36.9 C)     TempSrc: Oral     Resp: 18 23  18   Height:      Weight:   77 kg (169 lb 12.1 oz)   SpO2: 100% 98%  99%     Intake/Output Summary (Last 24 hours) at 04/23/15 0743 Last data filed at 04/23/15 0600  Gross per 24 hour  Intake 1107.7 ml  Output    725 ml  Net  382.7 ml      PHYSICAL EXAM  General: Well developed, well nourished, in no acute distress HEENT:  Normocephalic and atramatic Neck:  No JVD.  Lungs: Clear bilaterally to auscultation and percussion. Heart: HRRR . Normal S1 and S2 without gallops or murmurs.  Abdomen: Bowel sounds are positive, abdomen soft and non-tender  Msk:  Back normal, normal gait. Normal strength and tone for age. Extremities: 1+ pedal edema Neuro: Alert and oriented X 3. Psych:  Good affect, responds appropriately   LABS: Basic Metabolic Panel:  Recent Labs  04/21/15 0327 04/22/15 0518  NA 133* 134*  K 5.1 4.7  CL 95* 97*  CO2 21* 24  GLUCOSE 85 88  BUN 149* 157*  CREATININE 6.85* 6.64*  CALCIUM 9.2 8.6*   Liver Function Tests:  Recent Labs  04/20/15 1957  AST 53*  ALT 25  ALKPHOS 98  BILITOT 1.8*  PROT 6.3*  ALBUMIN 3.2*   No results for input(s): LIPASE, AMYLASE in the last 72 hours. CBC:  Recent Labs  04/22/15 0518 04/23/15 0606  WBC 8.7 8.2  HGB 9.8* 9.7*  HCT 29.1* 29.1*  MCV 87.8 88.7  PLT 142* 168   Cardiac Enzymes:  Recent Labs  04/21/15 0322 04/21/15 0925 04/21/15 1501  TROPONINI 0.07* 0.06* 0.05*   BNP: Invalid input(s): POCBNP D-Dimer: No results for input(s): DDIMER in the last 72 hours. Hemoglobin A1C: No results for input(s): HGBA1C in the last 72 hours. Fasting Lipid Panel: No results for input(s): CHOL, HDL, LDLCALC, TRIG, CHOLHDL, LDLDIRECT in the last 72 hours. Thyroid Function  Tests: No results for input(s): TSH, T4TOTAL, T3FREE, THYROIDAB in the last 72 hours.  Invalid input(s): FREET3 Anemia Panel: No results for input(s): VITAMINB12, FOLATE, FERRITIN, TIBC, IRON, RETICCTPCT in the last 72 hours.  No results found.   Echo   TELEMETRY: AF  ASSESSMENT AND PLAN:  Principal Problem:   Acute-on-chronic kidney injury Active Problems:   Hyperkalemia   Systolic heart failure, chronic   Hypertension, essential   Hyperlipidemia   Muscular deconditioning   Chronic systolic congestive heart failure   Atrial fibrillation    1. Acute on chronic renal failure, per Dr Candiss Norse, poor candidate for chronic dialysis 2. Non-ischemic cardiomyopathy, chronic systolic CHF, with persistent pedal edema 3. Chronic AF 4. Cellulitis, sepsis  REC  1. Continue iv dobut x 48-72h 2. Continue gentle hydration 3. Continue iv Vanco 4. Follow renal status closely   Philip Cowman, MD, PhD, [facility] 04/23/2015 7:43 AM

## 2015-04-23 NOTE — Progress Notes (Signed)
[place] at [place] NAME: Philip Morris    MR#:  [id number]  DATE OF BIRTH:  1924/09/08  SUBJECTIVE:  Now in SDU on dobubutamine drip.  Feeling slightly better, good UOP, Cr stable, tachycardic with runs of possible VTACH.  REVIEW OF SYSTEMS:  ROS   DRUG ALLERGIES:   Allergies  Allergen Reactions  . Codeine Nausea Only  . Penicillins Swelling  . Tetanus Toxoid Hives    VITALS:  Blood pressure 97/69, pulse 113, temperature 97.5 F (36.4 C), temperature source Oral, resp. rate 19, height 5\' 10"  (1.778 m), weight 77 kg (169 lb 12.1 oz), SpO2 98 %.  PHYSICAL EXAMINATION:  Physical Exam  Constitutional: He is oriented to person, place, and time.  Very weak, falling asleep between sentences  HENT:  Head: Normocephalic and atraumatic.  Eyes: EOM are normal. Pupils are equal, round, and reactive to light.  Pinpoint pupils  Neck: Normal range of motion.  Cardiovascular: Normal rate, regular rhythm and intact distal pulses.   Murmur heard. Pulmonary/Chest: Effort normal and breath sounds normal.  Abdominal: Soft. Bowel sounds are normal.  Lymphadenopathy:    He has no cervical adenopathy.  Neurological: He is oriented to person, place, and time.  Skin: Skin is warm and dry.  Bruising over abdomen, right upper thigh, erythema/induration/warmth and tenderness on the right posterior upper/inner thigh  Psychiatric: He has a normal mood and affect.     LABORATORY PANEL:   CBC  Recent Labs Lab 04/23/15 0606  WBC 8.2  HGB 9.7*  HCT 29.1*  PLT 168   ------------------------------------------------------------------------------------------------------------------  Chemistries   Recent Labs Lab 04/20/15 1957  04/23/15 0606  NA 134*  < > 137  K 5.7*  < > 4.3  CL 95*  < > 99*  CO2 21*  < > 24  GLUCOSE 122*  < > 83  BUN 140*  < > 152*  CREATININE 6.55*  < > 6.32*  CALCIUM 9.2  < > 8.7*  AST 53*  --   --   ALT  25  --   --   ALKPHOS 98  --   --   BILITOT 1.8*  --   --   < > = values in this interval not displayed. ------------------------------------------------------------------------------------------------------------------  Cardiac Enzymes  Recent Labs Lab 04/21/15 1501  TROPONINI 0.05*   ------------------------------------------------------------------------------------------------------------------  RADIOLOGY:  No results found.  EKG:   Orders placed or performed during the hospital encounter of 04/20/15  . ED EKG  . ED EKG  . EKG 12-Lead  . EKG 12-Lead  . EKG    ASSESSMENT AND PLAN:   Principal Problem:   Acute-on-chronic kidney injury Active Problems:   Hyperkalemia   Systolic heart failure, chronic   Hypertension, essential   Hyperlipidemia   Muscular deconditioning   Chronic systolic congestive heart failure   Atrial fibrillation   #1 sepsis: Blood cultures positive for gram-positive cocci. Likely source is cellulitis. Complicated as we cannot give fluids due to decreased ejection fraction. Continue vancomycin, add meropenem as he is allergic to penicillins. Await further culture results, no sensitivities yet.  #2. Acute kidney injury on chronic kidney disease: Severe renal failure with GFR of 7, creatinine stable at 6.6 - appreciate nephrology following. - continue gentle IVF. Minimal improvement with dobutamine gtt. UOP good. Will continue drip for another 24 hours. - hyperkalemia improved - not a good dialysis candidate due to advanced heart failure  #3. Hyperkalemia: improved - received  calcium, insulin, dextrose in emergency room.  #4 Elevated troponin:  - No NSTEMI, combination of strain and renal failure  #5 Systolic heart failure, chronic:  - Will continue to hold diuretics given above symptoms  #6 Chronic atrial fibrillation:  - rate controlled - Continue eliquis  #7 goals of care: Patient remains a DO NOT RESUSCITATE. He is a hospice  patient is an outpatient. No heroic measures. Appreciate Palliative care consultation.. At this point we are not transitioning to comfort care, will continue with plan as described above. However, if renal function does not improve it is likely that we will need to transition. This was discussed with the patient and his son today.  All the records are reviewed and case discussed with Care Management/Social Worker  Management plans discussed with the patient, family and they are in agreement.  CODE STATUS: DNR  TOTAL TIME TAKING CARE OF THIS PATIENT: 35 minutes.   Myrtis Ser M.D on 04/23/2015 at 2:10 PM  Between 7am to 6pm - Pager - [phone]  After 6pm go to [email or web address] - password [email or web address] [facility]  Office  [phone]  CC: Primary care physician; Miguel Aschoff, MD

## 2015-04-23 NOTE — Progress Notes (Signed)
Pt began coughing then vomiting. PRN  Zofran administered. Will continue to monitor. Kye Silverstein, RN 9:35 AM

## 2015-04-23 NOTE — Progress Notes (Signed)
Subjective:   His UOP appears to have improved some S Cr/BUN remains critically high Cardiac arrhythmias noted Son at bedisde  Objective:  Vital signs in last 24 hours:  Temp:  [97.5 F (36.4 C)-98.6 F (37 C)] 97.6 F (36.4 C) (05/12 0800) Pulse Rate:  [94-115] 109 (05/12 0800) Resp:  [15-23] 17 (05/12 0800) BP: (76-102)/(47-70) 101/70 mmHg (05/12 0800) SpO2:  [95 %-100 %] 99 % (05/12 0800) Weight:  [77 kg (169 lb 12.1 oz)] 77 kg (169 lb 12.1 oz) (05/12 0559)  Weight change:  Filed Weights   04/20/15 2003 04/21/15 0157 04/23/15 0559  Weight: 79.833 kg (176 lb) 75.705 kg (166 lb 14.4 oz) 77 kg (169 lb 12.1 oz)    Intake/Output: I/O last 3 completed shifts: In: 1107.7 [I.V.:1007.7; IV Piggyback:100] Out: 1280 [Urine:1280]   Intake/Output this shift:  Total I/O In: 11.4 [I.V.:11.4] Out: 175 [Urine:175]  Physical Exam: General: NAD, ill appearing  Head: Normocephalic, atraumatic. dry oral mucosal membranes  Eyes: Anicteric,   Neck: Supple, trachea midline  Lungs:  Clear to auscultation  Heart: Regular rate and rhythm  Abdomen:  Soft, nontender, distended, Ascites   Extremities:  1+ peripheral edema.  Neurologic: Nonfocal, moving all four extremities  Skin: No lesions  Access:     Basic Metabolic Panel:  Recent Labs Lab 04/20/15 1957 04/21/15 0322 04/21/15 0327 04/22/15 0518 04/23/15 0606  NA 134*  --  133* 134* 137  K 5.7*  --  5.1 4.7 4.3  CL 95*  --  95* 97* 99*  CO2 21*  --  21* 24 24  GLUCOSE 122*  --  85 88 83  BUN 140*  --  149* 157* 152*  CREATININE 6.55* 6.61* 6.85* 6.64* 6.32*  CALCIUM 9.2  --  9.2 8.6* 8.7*    Liver Function Tests:  Recent Labs Lab 04/20/15 1957  AST 53*  ALT 25  ALKPHOS 98  BILITOT 1.8*  PROT 6.3*  ALBUMIN 3.2*   No results for input(s): LIPASE, AMYLASE in the last 168 hours. No results for input(s): AMMONIA in the last 168 hours.  CBC:  Recent Labs Lab 04/20/15 1957 04/21/15 0322 04/22/15 0518  04/23/15 0606  WBC 10.9* 9.5 8.7 8.2  HGB 11.0* 10.2* 9.8* 9.7*  HCT 33.4* 31.6* 29.1* 29.1*  MCV 88.2 88.4 87.8 88.7  PLT 160 156 142* 168    Cardiac Enzymes:  Recent Labs Lab 04/20/15 1957 04/21/15 0322 04/21/15 0925 04/21/15 1501  TROPONINI 0.07* 0.07* 0.06* 0.05*    BNP: Invalid input(s): POCBNP  CBG:  Recent Labs Lab 04/22/15 1213  GLUCAP 41    Microbiology: Results for orders placed or performed during the hospital encounter of 04/20/15  Culture, blood (routine x 2)     Status: None (Preliminary result)   Collection Time: 04/21/15  3:01 PM  Result Value Ref Range Status   Specimen Description BLOOD  Final   Special Requests Normal  Final   Culture  Setup Time   Final    GRAM POSITIVE COCCI IN CHAINS IN BOTH AEROBIC AND ANAEROBIC BOTTLES CRITICAL RESULT CALLED TO, READ BACK BY AND VERIFIED WITH: JADIE COHEN 04/22/15 DV    Culture   Final    GRAM POSITIVE COCCI IN BOTH AEROBIC AND ANAEROBIC BOTTLES IDENTIFICATION TO FOLLOW    Report Status PENDING  Incomplete  Culture, blood (routine x 2)     Status: None (Preliminary result)   Collection Time: 04/21/15  3:01 PM  Result Value Ref Range Status  Specimen Description BLOOD  Final   Special Requests Normal  Final   Culture  Setup Time   Final    BLOOD ANAEROBIC BOTTLE GRAM POSITIVE COCCI IN CHAINS CRITICAL RESULT CALLED TO, READ BACK BY AND VERIFIED WITH: JADIE COHEN AT 2863 04/22/15 DV    Culture   Final    GRAM POSITIVE COCCI ANAEROBIC BOTTLE ONLY IDENTIFICATION TO FOLLOW    Report Status PENDING  Incomplete    Coagulation Studies: No results for input(s): LABPROT, INR in the last 72 hours.  Urinalysis:  Recent Labs  04/21/15 0645  COLORURINE YELLOW*  LABSPEC 1.011  PHURINE 5.0  GLUCOSEU NEGATIVE  HGBUR NEGATIVE  BILIRUBINUR NEGATIVE  KETONESUR NEGATIVE  PROTEINUR NEGATIVE  NITRITE NEGATIVE  LEUKOCYTESUR NEGATIVE      Imaging: No results found.   Medications:   . sodium  chloride 50 mL/hr at 04/22/15 1200  . DOBUTamine 5 mcg/kg/min (04/22/15 2000)   . amiodarone  200 mg Oral Daily  . brimonidine  1 drop Both Eyes BID  . budesonide-formoterol  2 puff Inhalation BID  . febuxostat  40 mg Oral Daily  . meropenem (MERREM) IV  500 mg Intravenous Q24H  . simvastatin  40 mg Oral Daily  . sodium chloride  3 mL Intravenous Q12H  . timolol  1 drop Both Eyes BID  . [START ON 04/24/2015] vancomycin  1,000 mg Intravenous Q72H   acetaminophen **OR** [DISCONTINUED] acetaminophen, albuterol, guaiFENesin, morphine injection, [DISCONTINUED] ondansetron **OR** ondansetron (ZOFRAN) IV  Assessment/ Plan:  Pt is a 81 y.o. yo male with a PMHX of CHF ef 10 %, AICD, was [facility]/2015 with weakness  1. ARF. Oliguric with critically elevated BUN and Cr Seems to have a good response to iv dobutamine Tolerating well May continue low dose iv fluids as PO intake is poor Due to severe cardiac dysfunction, will make a poor candidate for chronic dialysis  Patient was on hospice prior to coming to hospital  2. CKD st 4 - Baseline Cr 2.92/GFR 22 back in October 2015  3. Hyperkalemia - improved - low K diet  4. Sepsis- blood culture pos for GPC (5/10) - currently treated with vancomycin    LOS: 3 Seara Hinesley 5/12/20169:48 AM

## 2015-04-23 NOTE — Consult Note (Signed)
Palliative Medicine Inpatient Consult Follow Up Note   Name: Philip Morris Date: 04/23/2015 MRN: [id number]  DOB: [birth year]  Referring Physician: Aldean Jewett, MD  Palliative Care consult requested for this 81 y.o. male for goals of medical therapy in patient with NICM s/p ICD, CKD, admitted with A/CKD  Philip Morris is lying in bed. Complains of nausea. Episode of emesis this AM. Son at bedside.   REVIEW OF SYSTEMS:  Pain: None Dyspnea:  No Nausea/Vomiting:  Yes Diarrhea:  No Constipation:   No Depression:   Yes Anxiety:   No Fatigue:   Yes  CODE STATUS: DNR   PAST MEDICAL HISTORY: Past Medical History  Diagnosis Date  . Blood transfusion without reported diagnosis   . Congestive heart failure, unspecified   . Glaucoma   . Gout   . Hypertension 1970  . Arthritis 2012  . History of radiation therapy 1995    for prostate cancer  . Cancer 1995    prostate  . Skin cancer   . Atrial fibrillation   . COPD (chronic obstructive pulmonary disease)     PAST SURGICAL HISTORY:  Past Surgical History  Procedure Laterality Date  . Prostate surgery  2002  . Colonoscopy  2006  . Cardiac defibrillator placement  2014    Vital Signs: BP 101/70 mmHg  Pulse 109  Temp(Src) 97.6 F (36.4 C) (Oral)  Resp 17  Ht 5\' 10"  (1.778 m)  Wt 77 kg (169 lb 12.1 oz)  BMI 24.36 kg/m2  SpO2 99% Filed Weights   04/20/15 2003 04/21/15 0157 04/23/15 0559  Weight: 79.833 kg (176 lb) 75.705 kg (166 lb 14.4 oz) 77 kg (169 lb 12.1 oz)    Estimated body mass index is 24.36 kg/(m^2) as calculated from the following:   Height as of this encounter: 5\' 10"  (1.778 m).   Weight as of this encounter: 77 kg (169 lb 12.1 oz).  PHYSICAL EXAM: [place]: Critically ill appearing HEENT: OP clear, hearing intact Neck: Trachea midline  Cardiovascular: regular rhythm, tachycardic Pulmonary/Chest: Poor air movemnt ant fields, no audible wheeze Abdominal: distended, hypoactive bowel sounds GU: no  SP tenderness Extremities: + edema BLE's Neurological: grossly nonfocal Skin: no rashed Psychiatric: depressed  LABS: CBC:    Component Value Date/Time   WBC 8.2 04/23/2015 0606   WBC 13.6* 09/21/2014 0426   HGB 9.7* 04/23/2015 0606   HGB 10.2* 09/21/2014 0426   HCT 29.1* 04/23/2015 0606   HCT 30.2* 09/21/2014 0426   PLT 168 04/23/2015 0606   PLT 251 09/21/2014 0426   MCV 88.7 04/23/2015 0606   MCV 97 09/21/2014 0426   NEUTROABS 12.7* 09/21/2014 0426   LYMPHSABS 0.5* 09/21/2014 0426   MONOABS 0.4 09/21/2014 0426   EOSABS 0.0 09/21/2014 0426   BASOSABS 0.0 09/21/2014 0426   Comprehensive Metabolic Panel:    Component Value Date/Time   NA 137 04/23/2015 0606   NA 137 09/21/2014 0426   K 4.3 04/23/2015 0606   K 4.1 09/21/2014 0426   CL 99* 04/23/2015 0606   CL 102 09/21/2014 0426   CO2 24 04/23/2015 0606   CO2 25 09/21/2014 0426   BUN 152* 04/23/2015 0606   BUN 82* 09/21/2014 0426   CREATININE 6.32* 04/23/2015 0606   CREATININE 2.92* 09/21/2014 0426   GLUCOSE 83 04/23/2015 0606   GLUCOSE 145* 09/21/2014 0426   CALCIUM 8.7* 04/23/2015 0606   CALCIUM 8.2* 09/21/2014 0426   AST 53* 04/20/2015 1957   AST 33 09/19/2014 0400  ALT 25 04/20/2015 1957   ALT 22 09/19/2014 0400   ALKPHOS 98 04/20/2015 1957   ALKPHOS 117* 09/19/2014 0400   BILITOT 1.8* 04/20/2015 1957   PROT 6.3* 04/20/2015 1957   PROT 6.1* 09/19/2014 0400   ALBUMIN 3.2* 04/20/2015 1957   ALBUMIN 2.3* 09/19/2014 0400    IMPRESSION: Philip Morris is an 81 yo man with PMH of NICM with EF 10% s/p AICD, CKD, HTN, hyperlipidemia, COPD, a.fib, OA, glaucoma, gout, prostate cancer s/p sgy/XRT. He was admitted 04/20/15 with oliguric A/CKD with hyperkalemia. Pt now in CCU for dobutamine drip to see if renal function might be improved.   Pt complains of nausea/vomiting. No relief with ondansetron. Will try prochlorperazine.   PLAN: 1. Continue dobutamine drip  2. Prochlorperazine for nausea   More than 50% of  the visit was spent in counseling/coordination of care: YES  Time spent: 25 minutes

## 2015-04-23 NOTE — Plan of Care (Signed)
Problem: Phase I Progression Outcomes Goal: Pain controlled with appropriate interventions Outcome: Not Progressing Pt is unable to provide comfort. Repositioning and medication administration is not helping

## 2015-04-23 NOTE — Progress Notes (Signed)
Visit made. Patient seen lying in bed, eyes closed, easily awakened to voice. He reports feeling nauseous, per chart review patient did vomit prior to writer's visit. Dr. Ermalinda Memos present in the unit and placed a new order for compazine. Patient remains with poor po intake on dobutamine drip. Bun and creatinine remain elevated. Per cart review and discussion with patient's son Joseeduardo Brix. Patient to remain on the dobutamine drip for 24 more hours. Duke did have questions about the what will happen if his father does not "get better", briefly talked about care at the hospice home vs home. Patient remains critically ill. Emotional support offered. Zannie remained at bedside. Hospice team updated. Flo Shanks RN, BSN, [place] [facility] [place] [facility] [place], [facility] [phone] c

## 2015-04-24 DIAGNOSIS — R5383 Other fatigue: Secondary | ICD-10-CM

## 2015-04-24 LAB — CBC
HEMATOCRIT: 30.4 % — AB (ref 40.0–52.0)
HEMOGLOBIN: 10 g/dL — AB (ref 13.0–18.0)
MCH: 29.1 pg (ref 26.0–34.0)
MCHC: 32.8 g/dL (ref 32.0–36.0)
MCV: 88.7 fL (ref 80.0–100.0)
PLATELETS: 168 10*3/uL (ref 150–440)
RBC: 3.43 MIL/uL — ABNORMAL LOW (ref 4.40–5.90)
RDW: 18.9 % — ABNORMAL HIGH (ref 11.5–14.5)
WBC: 8.1 10*3/uL (ref 3.8–10.6)

## 2015-04-24 LAB — BASIC METABOLIC PANEL
Anion gap: 13 (ref 5–15)
BUN: 127 mg/dL — ABNORMAL HIGH (ref 6–20)
CO2: 23 mmol/L (ref 22–32)
Calcium: 8.6 mg/dL — ABNORMAL LOW (ref 8.9–10.3)
Chloride: 103 mmol/L (ref 101–111)
Creatinine, Ser: 5.92 mg/dL — ABNORMAL HIGH (ref 0.61–1.24)
GFR calc Af Amer: 9 mL/min — ABNORMAL LOW (ref >60)
GFR calc non Af Amer: 8 mL/min — ABNORMAL LOW (ref >60)
Glucose, Bld: 100 mg/dL — ABNORMAL HIGH (ref 65–99)
POTASSIUM: 4.1 mmol/L (ref 3.5–5.1)
Sodium: 139 mmol/L (ref 135–145)

## 2015-04-24 LAB — URINALYSIS COMPLETE WITH MICROSCOPIC (ARMC ONLY)
Bilirubin Urine: NEGATIVE
Glucose, UA: NEGATIVE mg/dL
KETONES UR: NEGATIVE mg/dL
Nitrite: NEGATIVE
Protein, ur: NEGATIVE mg/dL
Specific Gravity, Urine: 1.011 (ref 1.005–1.030)
pH: 5 (ref 5.0–8.0)

## 2015-04-24 MED ORDER — GABAPENTIN 100 MG PO CAPS
100.0000 mg | ORAL_CAPSULE | Freq: Three times a day (TID) | ORAL | Status: DC
  Administered 2015-04-24: 100 mg via ORAL
  Filled 2015-04-24: qty 1

## 2015-04-24 MED ORDER — OXYCODONE-ACETAMINOPHEN 5-325 MG PO TABS: 1.0000 | ORAL_TABLET | Freq: Four times a day (QID) | ORAL | Status: DC | PRN

## 2015-04-24 MED ORDER — MORPHINE SULFATE 20 MG/5ML PO SOLN: 5.0000 mg | ORAL | Status: AC | PRN

## 2015-04-24 MED ORDER — DIAZEPAM 5 MG/ML IJ SOLN: 2.5000 mg | Freq: Two times a day (BID) | INTRAMUSCULAR | Status: DC

## 2015-04-24 MED ORDER — ALBUTEROL SULFATE (2.5 MG/3ML) 0.083% IN NEBU: 2.5000 mg | INHALATION_SOLUTION | Freq: Four times a day (QID) | RESPIRATORY_TRACT | Status: AC | PRN

## 2015-04-24 MED ORDER — PROCHLORPERAZINE 25 MG RE SUPP: 25.0000 mg | Freq: Two times a day (BID) | RECTAL | Status: AC | PRN

## 2015-04-24 MED ORDER — DIAZEPAM 5 MG/ML IJ SOLN: 2.5000 mg | Freq: Two times a day (BID) | INTRAMUSCULAR | Status: DC | PRN

## 2015-04-24 MED ORDER — LORAZEPAM 1 MG PO TABS: 1.0000 mg | ORAL_TABLET | ORAL | Status: AC | PRN

## 2015-04-24 MED ORDER — DIAZEPAM 5 MG/ML IJ SOLN
2.5000 mg | Freq: Once | INTRAMUSCULAR | Status: AC
  Administered 2015-04-24: 2.5 mg via INTRAVENOUS
  Filled 2015-04-24: qty 2

## 2015-04-24 NOTE — Progress Notes (Signed)
Visit made after conversation with Palliative Care physician Dr. Ermalinda Memos, reporting that patient and his family have chosen to pursue comfort at the hospice home. Services reviewed with patient's daughter Opal Sidles, understanding voiced, questions answered, emotional support offered.  Patient was intermittently awake during the conversation. He appeared restless through out the  visit. Repositioned with staff RN, this did not provide relief. Dr. Ermalinda Memos notified, orders given. Patient then received IV morphine 4 mg and IV diazepam 2.5 mg by staff RN. This provided short term relief. ICD to be turned off prior to discharge to the hospice home, order in place. [employer] rep notified. MD,CSW, and RN all aware of plan for transport to the hospice home with portable DNR in place. Report called to hospice home, discharge information faxed to referral intake. EMS notified after ICD was turned off. Thank you. Flo Shanks RN, BSN, [place] [facility] [place] [facility] [place], [facility] [phone]

## 2015-04-24 NOTE — Clinical Social Work Note (Signed)
Santiago Glad with [facility] informed CSW that patient was going to transfer to hospice home of [place] today. CSW prepared packet for discharge.  Shela Leff MSW,LCSWA [phone]

## 2015-04-24 NOTE — Progress Notes (Addendum)
IV removed from right hand. Pt changed into transfer gown and transfer sheets. EMS Wheeled pt out. Kathi Simpers, RN 5:16 PM Transfer packet given to EMS including gold DNR form. Vayla Wilhelmi, RN 5:18 PM

## 2015-04-24 NOTE — Progress Notes (Signed)
[facility] Cardiology  SUBJECTIVE:  No chest pain   Filed Vitals:   04/24/15 0800 04/24/15 0900 04/24/15 1000 04/24/15 1015  BP: 94/73 95/75 137/105 87/74  Pulse: 106 107 102 102  Temp: 97.7 F (36.5 C)     TempSrc: Oral     Resp: 7 15 19 16   Height:      Weight:      SpO2: 98% 98% 99% 98%     Intake/Output Summary (Last 24 hours) at 04/24/15 1307 Last data filed at 04/24/15 0600  Gross per 24 hour  Intake 1401.1 ml  Output    975 ml  Net  426.1 ml      PHYSICAL EXAM  General:  Chronically ill-appearing HEENT:  Normocephalic and atramatic Neck:  No JVD.  Lungs: Clear bilaterally to auscultation and percussion. Heart: HRRR . Normal S1 and S2 without gallops or murmurs.  Abdomen: Bowel sounds are positive, abdomen soft and non-tender  Msk:  Back normal, normal gait. Normal strength and tone for age. Extremities:  Trace to 1+ bilateral pedal edema  Neuro: Alert and oriented X 3. Psych:  Good affect, responds appropriately   LABS: Basic Metabolic Panel:  Recent Labs  04/23/15 0606 04/24/15 0508  NA 137 139  K 4.3 4.1  CL 99* 103  CO2 24 23  GLUCOSE 83 100*  BUN 152* 127*  CREATININE 6.32* 5.92*  CALCIUM 8.7* 8.6*   Liver Function Tests: No results for input(s): AST, ALT, ALKPHOS, BILITOT, PROT, ALBUMIN in the last 72 hours. No results for input(s): LIPASE, AMYLASE in the last 72 hours. CBC:  Recent Labs  04/23/15 0606 04/24/15 0508  WBC 8.2 8.1  HGB 9.7* 10.0*  HCT 29.1* 30.4*  MCV 88.7 88.7  PLT 168 168   Cardiac Enzymes:  Recent Labs  04/21/15 1501  TROPONINI 0.05*   BNP: Invalid input(s): POCBNP D-Dimer: No results for input(s): DDIMER in the last 72 hours. Hemoglobin A1C: No results for input(s): HGBA1C in the last 72 hours. Fasting Lipid Panel: No results for input(s): CHOL, HDL, LDLCALC, TRIG, CHOLHDL, LDLDIRECT in the last 72 hours. Thyroid Function Tests: No results for input(s): TSH, T4TOTAL, T3FREE, THYROIDAB in the last 72  hours.  Invalid input(s): FREET3 Anemia Panel: No results for input(s): VITAMINB12, FOLATE, FERRITIN, TIBC, IRON, RETICCTPCT in the last 72 hours.  No results found.   Echo   TELEMETRY:  Atrial fibrillation:  ASSESSMENT AND PLAN:  Principal Problem:   Acute-on-chronic kidney injury Active Problems:   Chronic systolic congestive heart failure   Hyperkalemia   Systolic heart failure, chronic   Hypertension, essential   Atrial fibrillation   Hyperlipidemia   Muscular deconditioning    1. Nonischemic dilated cardiomyopathy, chronic systolic congestive heart failure, on appropriate heart failure regimen, currently on dobutamine drip with concomitant improvement and cardiac output, without evidence for ventricular arrhythmias 2. Atrial fibrillation, rate 90 to  100 bpm 3. Acute on chronic kidney disease, oliguria modestly improved on dobutamine drip,  mild improvement in BUN and creatinine,  per Dr. Candiss Norse, not a candidate for a long-term chronic dialysis 4. Right lower extremity cellulitis, sepsis, on IV vancomycin 5. Borderline elevated troponin likely due to demand supply ischemia  REC  1. Continue gentle hydration 2. Continue dobutamine drip for total of 72 hours 3. Continue to hold diuretics 4. Agree deactivating AICD 5. Likely transition to [facility], MD, PhD, [facility] 04/24/2015 1:07 PM

## 2015-04-24 NOTE — Consult Note (Signed)
Palliative Medicine Inpatient Consult Follow Up Note   Name: SION REINDERS Date: 04/24/2015 MRN: [id number]  DOB: 13-Oct-1924  Referring Physician: Aldean Jewett, MD  Palliative Care consult requested for this 81 y.o. male for goals of medical therapy in patient with NICM s/p ICD, CKD, admitted with A/CKD.   Mr Britten is sitting up in bed. Very uncomfortable. Lethargic but answers questions appropriately. Family at bedside.    REVIEW OF SYSTEMS:  Pain: Mild Dyspnea:  Yes Nausea/Vomiting:  Yes Diarrhea:  No Constipation:   No Depression:   Yes Anxiety:   No Fatigue:   Yes  CODE STATUS: DNR   PAST MEDICAL HISTORY: Past Medical History  Diagnosis Date  . Blood transfusion without reported diagnosis   . Congestive heart failure, unspecified   . Glaucoma   . Gout   . Hypertension 1970  . Arthritis 2012  . History of radiation therapy 1995    for prostate cancer  . Cancer 1995    prostate  . Skin cancer   . Atrial fibrillation   . COPD (chronic obstructive pulmonary disease)     PAST SURGICAL HISTORY:  Past Surgical History  Procedure Laterality Date  . Prostate surgery  2002  . Colonoscopy  2006  . Cardiac defibrillator placement  2014    Vital Signs: BP 87/74 mmHg  Pulse 102  Temp(Src) 97.7 F (36.5 C) (Oral)  Resp 16  Ht 5\' 10"  (1.778 m)  Wt 77 kg (169 lb 12.1 oz)  BMI 24.36 kg/m2  SpO2 98% Filed Weights   04/20/15 2003 04/21/15 0157 04/23/15 0559  Weight: 79.833 kg (176 lb) 75.705 kg (166 lb 14.4 oz) 77 kg (169 lb 12.1 oz)    Estimated body mass index is 24.36 kg/(m^2) as calculated from the following:   Height as of this encounter: 5\' 10"  (1.778 m).   Weight as of this encounter: 77 kg (169 lb 12.1 oz).  PHYSICAL EXAM: [place]: Critically ill appearing HEENT: OP clear, hearing intact Neck: Trachea midline  Cardiovascular: regular rhythm, tachycardic Pulmonary/Chest: Poor air movemnt ant fields, no audible wheeze Abdominal: distended,  hypoactive bowel sounds GU: no SP tenderness Extremities: + edema BLE's Neurological: grossly nonfocal Skin: no rashes Psychiatric: lethargic, oriented LABS: CBC:    Component Value Date/Time   WBC 8.1 04/24/2015 0508   WBC 13.6* 09/21/2014 0426   HGB 10.0* 04/24/2015 0508   HGB 10.2* 09/21/2014 0426   HCT 30.4* 04/24/2015 0508   HCT 30.2* 09/21/2014 0426   PLT 168 04/24/2015 0508   PLT 251 09/21/2014 0426   MCV 88.7 04/24/2015 0508   MCV 97 09/21/2014 0426   NEUTROABS 12.7* 09/21/2014 0426   LYMPHSABS 0.5* 09/21/2014 0426   MONOABS 0.4 09/21/2014 0426   EOSABS 0.0 09/21/2014 0426   BASOSABS 0.0 09/21/2014 0426   Comprehensive Metabolic Panel:    Component Value Date/Time   NA 139 04/24/2015 0508   NA 137 09/21/2014 0426   K 4.1 04/24/2015 0508   K 4.1 09/21/2014 0426   CL 103 04/24/2015 0508   CL 102 09/21/2014 0426   CO2 23 04/24/2015 0508   CO2 25 09/21/2014 0426   BUN 127* 04/24/2015 0508   BUN 82* 09/21/2014 0426   CREATININE 5.92* 04/24/2015 0508   CREATININE 2.92* 09/21/2014 0426   GLUCOSE 100* 04/24/2015 0508   GLUCOSE 145* 09/21/2014 0426   CALCIUM 8.6* 04/24/2015 0508   CALCIUM 8.2* 09/21/2014 0426   AST 53* 04/20/2015 1957   AST 33 09/19/2014 0400  ALT 25 04/20/2015 1957   ALT 22 09/19/2014 0400   ALKPHOS 98 04/20/2015 1957   ALKPHOS 117* 09/19/2014 0400   BILITOT 1.8* 04/20/2015 1957   PROT 6.3* 04/20/2015 1957   PROT 6.1* 09/19/2014 0400   ALBUMIN 3.2* 04/20/2015 1957   ALBUMIN 2.3* 09/19/2014 0400    IMPRESSION: Mr Hopping is an 81 yo man with PMH of NICM with EF 10% s/p AICD, CKD, HTN, hyperlipidemia, COPD, a.fib, OA, glaucoma, gout, prostate cancer s/p sgy/XRT. He was admitted 04/20/15 with oliguric A/CKD with hyperkalemia. Pt now in CCU for dobutamine drip to see if renal function might be improved. Pt's renal function minimally better.   I spoke with pt in the presence of his daughter/HCPOA and son. Pt says that he is tired and does not  want to continue aggressive care. We discussed the options of home with hospice vs Hospice Home and pt chose Hospice Home. I discussed deactivating AICD with pt and he is in agreement. [employer] rep notified.   PLAN: Hospice Home  REFERRALS TO BE ORDERED:  Hospice   More than 50% of the visit was spent in counseling/coordination of care: YES  Time spent: 40 minutes

## 2015-04-24 NOTE — Consult Note (Signed)
Hospice Home Discharge Orders   Patient:  Philip Morris is an 81 y.o., male MRN:  [id number] DOB:  10-Nov-1924 Patient phone:  [phone] (home)  Patient address:   [place] [address],     Admit: Admit to Hospice Home  Code Status: DNR  Diet: as tolerated  Activity: as tolerated  Oxygen: use for comfort  Foley: Leave foley cath for urinary incontinence/previous skin breakdown   Date of Admission:  04/20/2015   Principal Problem: Acute-on-chronic kidney injury Discharge Diagnoses: Patient Active Problem List   Diagnosis Date Noted  . Muscular deconditioning [R29.898] 04/21/2015  . Chronic systolic congestive heart failure [I50.22] 04/21/2015  . Atrial fibrillation [I48.91] 04/21/2015  . Hyperkalemia [E87.5] 04/20/2015  . Acute-on-chronic kidney injury [N17.9, N18.9] 04/20/2015  . Systolic heart failure, chronic [I50.22] 04/20/2015  . Hypertension, essential [I10] 04/20/2015  . Hyperlipidemia [E78.5] 04/20/2015  . Calculus of gallbladder with other cholecystitis, without mention of obstruction [K80.10] 02/22/2014  . Imaging abnormalities [R93.8] 02/22/2014  . Pancreatic cyst [K86.2] 02/22/2014      Medications: May crush or use liquid when appropriate. May change to rectal route if unable to swallow.    Medication List    STOP taking these medications        albuterol 108 (90 BASE) MCG/ACT inhaler  Commonly known as:  PROVENTIL HFA;VENTOLIN HFA  Replaced by:  albuterol (2.5 MG/3ML) 0.083% nebulizer solution     amiodarone 200 MG tablet  Commonly known as:  PACERONE     budesonide-formoterol 80-4.5 MCG/ACT inhaler  Commonly known as:  SYMBICORT     CALCIUM + D PO     doxazosin 8 MG tablet  Commonly known as:  CARDURA     ELIQUIS 5 MG Tabs tablet  Generic drug:  apixaban     febuxostat 40 MG tablet  Commonly known as:  ULORIC     HYDROcodone-homatropine 5-1.5 MG/5ML syrup  Commonly known as:  HYCODAN     multivitamin  tablet     potassium chloride 10 MEQ CR capsule  Commonly known as:  MICRO-K     PREDNISONE PO     promethazine 25 MG tablet  Commonly known as:  PHENERGAN     simvastatin 40 MG tablet  Commonly known as:  ZOCOR      TAKE these medications        albuterol (2.5 MG/3ML) 0.083% nebulizer solution  Commonly known as:  PROVENTIL  Take 3 mLs (2.5 mg total) by nebulization every 6 (six) hours as needed for wheezing or shortness of breath.     brimonidine 0.2 % ophthalmic solution  Commonly known as:  ALPHAGAN  Place 1 drop into both eyes 2 (two) times daily.     dextromethorphan 30 MG/5ML liquid  Commonly known as:  DELSYM  Take 30 mg by mouth as needed for cough.     diphenhydrAMINE 25 MG tablet  Commonly known as:  BENADRYL  Take 25 mg by mouth 2 (two) times daily.     furosemide 40 MG tablet  Commonly known as:  LASIX  Take 40 mg by mouth 3 (three) times daily.     LORazepam 1 MG tablet  Commonly known as:  ATIVAN  Take 1 tablet (1 mg total) by mouth every hour as needed for anxiety.     METOLAZONE PO  Take 1 tablet by mouth daily.     morphine 20 MG/5ML solution  Take 1.3 mLs (5.2 mg total) by mouth every hour as  needed for pain.     prochlorperazine 25 MG suppository  Commonly known as:  COMPAZINE  Place 1 suppository (25 mg total) rectally every 12 (twelve) hours as needed for nausea or vomiting.     timolol 0.5 % ophthalmic solution  Commonly known as:  TIMOPTIC  Place 1 drop into both eyes 2 (two) times daily.           Comments:    SignedGrayland Jack Aadyn Buchheit 04/24/2015, 1:14 PM

## 2015-04-24 NOTE — Progress Notes (Signed)
Subjective:   His UOP appears to have improved some S Cr/BUN remains critically high although improved slightly from yesterday Family at bedisde + Nausea this morning  Objective:  Vital signs in last 24 hours:  Temp:  [97.5 F (36.4 C)-97.9 F (36.6 C)] 97.7 F (36.5 C) (05/13 0800) Pulse Rate:  [96-114] 106 (05/13 0800) Resp:  [7-34] 7 (05/13 0800) BP: (76-102)/(47-74) 94/73 mmHg (05/13 0800) SpO2:  [96 %-100 %] 98 % (05/13 0800)  Weight change:  Filed Weights   04/20/15 2003 04/21/15 0157 04/23/15 0559  Weight: 79.833 kg (176 lb) 75.705 kg (166 lb 14.4 oz) 77 kg (169 lb 12.1 oz)    Intake/Output: I/O last 3 completed shifts: In: 2125.1 [I.V.:1955.1; NG/GT:120; IV Piggyback:50] Out: 1250 [Urine:1250]   Intake/Output this shift:     Physical Exam: General: NAD, ill appearing  Head: Normocephalic, atraumatic. dry oral mucosal membranes  Eyes: Anicteric,   Neck: Supple, trachea midline  Lungs:  Clear to auscultation  Heart: Regular rate and rhythm  Abdomen:  Soft, nontender, distended, Ascites   Extremities: no peripheral edema.  Neurologic: Nonfocal, moving all four extremities  Skin: No lesions  Access:     Basic Metabolic Panel:  Recent Labs Lab 04/20/15 1957 04/21/15 0322 04/21/15 0327 04/22/15 0518 04/23/15 0606 04/24/15 0508  NA 134*  --  133* 134* 137 139  K 5.7*  --  5.1 4.7 4.3 4.1  CL 95*  --  95* 97* 99* 103  CO2 21*  --  21* 24 24 23   GLUCOSE 122*  --  85 88 83 100*  BUN 140*  --  149* 157* 152* 127*  CREATININE 6.55* 6.61* 6.85* 6.64* 6.32* 5.92*  CALCIUM 9.2  --  9.2 8.6* 8.7* 8.6*    Liver Function Tests:  Recent Labs Lab 04/20/15 1957  AST 53*  ALT 25  ALKPHOS 98  BILITOT 1.8*  PROT 6.3*  ALBUMIN 3.2*   No results for input(s): LIPASE, AMYLASE in the last 168 hours. No results for input(s): AMMONIA in the last 168 hours.  CBC:  Recent Labs Lab 04/20/15 1957 04/21/15 0322 04/22/15 0518 04/23/15 0606 04/24/15 0508   WBC 10.9* 9.5 8.7 8.2 8.1  HGB 11.0* 10.2* 9.8* 9.7* 10.0*  HCT 33.4* 31.6* 29.1* 29.1* 30.4*  MCV 88.2 88.4 87.8 88.7 88.7  PLT 160 156 142* 168 168    Cardiac Enzymes:  Recent Labs Lab 04/20/15 1957 04/21/15 0322 04/21/15 0925 04/21/15 1501  TROPONINI 0.07* 0.07* 0.06* 0.05*    BNP: Invalid input(s): POCBNP  CBG:  Recent Labs Lab 04/22/15 1213  GLUCAP 40    Microbiology: Results for orders placed or performed during the hospital encounter of 04/20/15  Culture, blood (routine x 2)     Status: None (Preliminary result)   Collection Time: 04/21/15  3:01 PM  Result Value Ref Range Status   Specimen Description BLOOD  Final   Special Requests Normal  Final   Culture  Setup Time   Final    GRAM POSITIVE COCCI IN CHAINS IN BOTH AEROBIC AND ANAEROBIC BOTTLES CRITICAL RESULT CALLED TO, READ BACK BY AND VERIFIED WITH: JADIE COHEN 04/22/15 DV    Culture   Final    Streptococcus galloyticus ssp pasteurianus IN BOTH AEROBIC AND ANAEROBIC BOTTLES SUSCEPTIBILITIES TO FOLLOW    Report Status PENDING  Incomplete  Culture, blood (routine x 2)     Status: None (Preliminary result)   Collection Time: 04/21/15  3:01 PM  Result Value Ref Range  Status   Specimen Description BLOOD  Final   Special Requests Normal  Final   Culture  Setup Time   Final    BLOOD ANAEROBIC BOTTLE GRAM POSITIVE COCCI IN CHAINS CRITICAL RESULT CALLED TO, READ BACK BY AND VERIFIED WITH: JADIE COHEN AT 1275 04/22/15 DV    Culture   Final    GRAM POSITIVE COCCI ANAEROBIC BOTTLE ONLY IDENTIFICATION TO FOLLOW REPEATING IDENTIFICATION    Report Status PENDING  Incomplete    Coagulation Studies: No results for input(s): LABPROT, INR in the last 72 hours.  Urinalysis:  Recent Labs  04/24/15 0015  COLORURINE YELLOW*  LABSPEC 1.011  PHURINE 5.0  GLUCOSEU NEGATIVE  HGBUR 3+*  BILIRUBINUR NEGATIVE  KETONESUR NEGATIVE  PROTEINUR NEGATIVE  NITRITE NEGATIVE  LEUKOCYTESUR 1+*       Imaging: No results found.   Medications:   . sodium chloride 50 mL/hr at 04/23/15 1900  . DOBUTamine 5 mcg/kg/min (04/24/15 1700)   . amiodarone  200 mg Oral Daily  . brimonidine  1 drop Both Eyes BID  . budesonide-formoterol  2 puff Inhalation BID  . febuxostat  40 mg Oral Daily  . feeding supplement (NEPRO CARB STEADY)  237 mL Oral BID BM  . gabapentin  100 mg Oral TID  . meropenem (MERREM) IV  500 mg Intravenous Q24H  . simvastatin  40 mg Oral Daily  . sodium chloride  3 mL Intravenous Q12H  . timolol  1 drop Both Eyes BID  . vancomycin  1,000 mg Intravenous Q72H   acetaminophen **OR** [DISCONTINUED] acetaminophen, albuterol, guaiFENesin, morphine injection, [DISCONTINUED] ondansetron **OR** ondansetron (ZOFRAN) IV, prochlorperazine, traMADol  Assessment/ Plan:  Pt is a 81 y.o. yo male with a PMHX of CHF ef 10 %, AICD, was admitted to [facility] on 04/20/2015 with weakness  1. ARF. Oliguric with critically elevated BUN and Cr Seems to have a good response to iv dobutamine Tolerating fair May continue low dose iv fluids as PO intake is poor Due to severe cardiac dysfunction, will make a poor candidate for chronic dialysis  Patient was on hospice prior to coming to hospital  2. CKD st 4 - Baseline Cr 2.92/GFR 22 back in October 2015  3. Hyperkalemia - improved - low K diet- avoid orange juice  4. Sepsis- blood culture pos for GPC (5/10) - currently treated with vancomycin    LOS: 4 Philip Morris 5/13/201610:16 AM

## 2015-04-25 LAB — CULTURE, BLOOD (ROUTINE X 2)
SPECIAL REQUESTS: NORMAL
Special Requests: NORMAL

## 2015-05-02 NOTE — Discharge Summary (Signed)
[facility]   PATIENT NAME: Philip Morris    MR#:  [id number]  DATE OF BIRTH:  1924-06-11  DATE OF ADMISSION:  04/20/2015 ADMITTING PHYSICIAN: Philip Butte, MD  DATE OF DISCHARGE: 04/24/2015  5:39 PM  PRIMARY CARE PHYSICIAN: Philip Aschoff, MD    ADMISSION DIAGNOSIS:  Hyperkalemia [E87.5] Acute renal failure, unspecified acute renal failure type [N17.9]  DISCHARGE DIAGNOSIS:  Principal Problem:   Acute-on-chronic kidney injury Active Problems:   Hyperkalemia   Systolic heart failure, chronic   Hypertension, essential   Hyperlipidemia   Muscular deconditioning   Chronic systolic congestive heart failure   Atrial fibrillation   SECONDARY DIAGNOSIS:   Past Medical History  Diagnosis Date  . Blood transfusion without reported diagnosis   . Congestive heart failure, unspecified   . Glaucoma   . Gout   . Hypertension 1970  . Arthritis 2012  . History of radiation therapy 1995    for prostate cancer  . Cancer 1995    prostate  . Skin cancer   . Atrial fibrillation   . COPD (chronic obstructive pulmonary disease)     HOSPITAL COURSE:   #1 sepsis: Blood cultures positive for gram-positive cocci. Likely source is cellulitis. Complicated as we cannot give fluids due to decreased ejection fraction of 10%. He was treated with vancomycin and meropenem throughout the hospitalization. Patient is remained marginal. Did add dobutamine to help increased blood pressures with minimal improvement.  #2. Acute kidney injury on chronic kidney disease: Severe renal failure with GFR of 7, creatinine at 6.6. He was followed by nephrology throughout the hospitalization. He was continued on gentle IV hydration as well as dobutamine drip. Urine output remained robust. He is not a good candidate for hemodialysis due to advanced heart failure and age. He had minimal improvement in renal function.  #3. Hyperkalemia:  improved - received calcium, insulin, dextrose in emergency room. Potassium improved and remained stable throughout the rest of the hospitalization.  #4 Elevated troponin:  - No NSTEMI, combination of strain and renal failure. No chest pain shortness of breath.  #5 Systolic heart failure, chronic: Ejection fraction 10% - Will continue to hold diuretics given above symptoms  #6 Chronic atrial fibrillation:  - rate controlled - Continue eliquis  #7 goals of care: Patient remains a DO NOT RESUSCITATE. He had been a hospice patient is an outpatient. He presented with sepsis and acute renal failure. He was treated with fairly aggressive medical management with minimal improvement. In consultation with palliative care and meeting with the family and in light of his severe cardiac and renal disease it was decided to discontinue dobutamine drip and other aggressive care measures and transfer Philip Morris to hospice home.  DISCHARGE CONDITIONS:   To hospice home  CONSULTS OBTAINED:  Treatment Team:  Philip Iba, MD  DRUG ALLERGIES:   Allergies  Allergen Reactions  . Codeine Nausea Only  . Penicillins Swelling  . Tetanus Toxoid Hives    DISCHARGE MEDICATIONS:   Discharge Medication List as of 04/24/2015  5:40 PM    START taking these medications   Details  albuterol (PROVENTIL) (2.5 MG/3ML) 0.083% nebulizer solution Take 3 mLs (2.5 mg total) by nebulization every 6 (six) hours as needed for wheezing or shortness of breath., Starting 04/24/2015, Until Discontinued, Normal    LORazepam (ATIVAN) 1 MG tablet Take 1 tablet (1 mg total) by mouth every hour as needed for anxiety., Starting 04/24/2015, Until Discontinued,  Print    morphine 20 MG/5ML solution Take 1.3 mLs (5.2 mg total) by mouth every hour as needed for pain., Starting 04/24/2015, Until Discontinued, Print    prochlorperazine (COMPAZINE) 25 MG suppository Place 1 suppository (25 mg total) rectally every 12 (twelve) hours as  needed for nausea or vomiting., Starting 04/24/2015, Until Discontinued, Normal      CONTINUE these medications which have NOT CHANGED   Details  brimonidine (ALPHAGAN) 0.2 % ophthalmic solution Place 1 drop into both eyes 2 (two) times daily., Until Discontinued, Historical Med    dextromethorphan (DELSYM) 30 MG/5ML liquid Take 30 mg by mouth as needed for cough., Until Discontinued, Historical Med    diphenhydrAMINE (BENADRYL) 25 MG tablet Take 25 mg by mouth 2 (two) times daily., Until Discontinued, Historical Med    furosemide (LASIX) 40 MG tablet Take 40 mg by mouth 3 (three) times daily. , Until Discontinued, Historical Med    METOLAZONE PO Take 1 tablet by mouth daily., Until Discontinued, Historical Med    timolol (TIMOPTIC) 0.5 % ophthalmic solution Place 1 drop into both eyes 2 (two) times daily., Until Discontinued, Historical Med      STOP taking these medications     albuterol (PROVENTIL HFA;VENTOLIN HFA) 108 (90 BASE) MCG/ACT inhaler      amiodarone (PACERONE) 200 MG tablet      apixaban (ELIQUIS) 5 MG TABS tablet      budesonide-formoterol (SYMBICORT) 80-4.5 MCG/ACT inhaler      Calcium Carbonate-Vitamin D (CALCIUM + D PO)      doxazosin (CARDURA) 8 MG tablet      febuxostat (ULORIC) 40 MG tablet      HYDROcodone-homatropine (HYCODAN) 5-1.5 MG/5ML syrup      Multiple Vitamin (MULTIVITAMIN) tablet      potassium chloride (MICRO-K) 10 MEQ CR capsule      PREDNISONE PO      promethazine (PHENERGAN) 25 MG tablet      simvastatin (ZOCOR) 40 MG tablet          DISCHARGE INSTRUCTIONS:    See AVS  If you experience worsening of your admission symptoms, develop shortness of breath, life threatening emergency, suicidal or homicidal thoughts you must seek medical attention immediately by calling 911 or calling your MD immediately  if symptoms less severe.  You Must read complete instructions/literature along with all the possible adverse reactions/side  effects for all the Medicines you take and that have been prescribed to you. Take any new Medicines after you have completely understood and accept all the possible adverse reactions/side effects.   Please note  You were cared for by a hospitalist during your hospital stay. If you have any questions about your discharge medications or the care you received while you were in the hospital after you are discharged, you can call the unit and asked to speak with the hospitalist on call if the hospitalist that took care of you is not available. Once you are discharged, your primary care physician will handle any further medical issues. Please note that NO REFILLS for any discharge medications will be authorized once you are discharged, as it is imperative that you return to your primary care physician (or establish a relationship with a primary care physician if you do not have one) for your aftercare needs so that they can reassess your need for medications and monitor your lab values.    Today   CHIEF COMPLAINT:   Chief Complaint  Patient presents with  . Weakness  .  Fall    HISTORY OF PRESENT ILLNESS:   Philip Morris is a 81 y.o. male with a known history of systolic congestive heart failure, ejection fraction 10%, AICD placement, chronic kidney disease Baseline creatinine 1.5 who is presenting with generalized weakness. He describes progressive weakness over the last month or so duration to the point today where he actually fell. No loss of consciousness, no head trauma. He was unable to get off of the floor. In the emergency department noted to have markedly abnormal labs. Including acute kidney injury and hyperkalemia. Of note the patient is on hospice care at home for congestive heart failure  VITAL SIGNS:  Blood pressure 89/65, pulse 84, temperature 97.7 F (36.5 C), temperature source Oral, resp. rate 26, height 5\' 10"  (1.778 m), weight 77 kg (169 lb 12.1 oz), SpO2 97 %.  I/O:  No  intake or output data in the 24 hours ending 05/02/15 1523  PHYSICAL EXAMINATION:  GENERAL:  81 y.o.-year-old patient lying in the bed him a very uncomfortable, frustrated and fatigued EYES: Pupils point, round, reactive to light and accommodation. No scleral icterus. Extraocular muscles intact.  HEENT: Head atraumatic, normocephalic. Oropharynx and nasopharynx clear. Mucous membranes are dry NECK:  Supple, no jugular venous distention. No thyroid enlargement, no tenderness.  LUNGS: Bibasilar crackles with scattered wheezes, no rales,rhonchi or crepitation. No use of accessory muscles of respiration.  CARDIOVASCULAR: S1, S2 normal. No murmurs, rubs, or gallops.  ABDOMEN: Soft, non-tender, non-distended. Bowel sounds present. No organomegaly or mass.  EXTREMITIES: No pedal edema, cyanosis, or clubbing.  NEUROLOGIC: Cranial nerves II through XII are intact. Muscle strength 5/5 in all extremities. Sensation intact. Gait not checked.  PSYCHIATRIC: Anxious, uncomfortable, sleeps between questions  SKIN: Bruising over abdomen, upper right thigh, erythema induration and warmth on the right posterior upper inner thigh  DATA REVIEW:   CBC No results for input(s): WBC, HGB, HCT, PLT in the last 168 hours.  Chemistries  No results for input(s): NA, K, CL, CO2, GLUCOSE, BUN, CREATININE, CALCIUM, MG, AST, ALT, ALKPHOS, BILITOT in the last 168 hours.  Invalid input(s): GFRCGP  Cardiac Enzymes No results for input(s): TROPONINI in the last 168 hours.  Microbiology Results  Results for orders placed or performed during the hospital encounter of 04/20/15  Culture, blood (routine x 2)     Status: None   Collection Time: 04/21/15  3:01 PM  Result Value Ref Range Status   Specimen Description BLOOD  Final   Special Requests Normal  Final   Culture  Setup Time   Final    GRAM POSITIVE COCCI IN CHAINS IN BOTH AEROBIC AND ANAEROBIC BOTTLES CRITICAL RESULT CALLED TO, READ BACK BY AND VERIFIED WITH:  JADIE COHEN 04/22/15 DV    Culture   Final    Streptococcus galloyticus ssp pasteurianus IN BOTH AEROBIC AND ANAEROBIC BOTTLES    Report Status 04/27/15 FINAL  Final   Organism ID, Bacteria Streptococcus galloyticus ssp pasteurianus  Final      Susceptibility   Streptococcus galloyticus ssp pasteurianus - MIC*    CEFTRIAXONE Value in next row Sensitive      SENSITIVE0.25    PENICILLIN Value in next row Intermediate      INTERMEDIATE0.38    VANCOMYCIN Value in next row Sensitive      SENSITIVE0.25    LEVOFLOXACIN Value in next row Resistant      RESISTANT>32    * Streptococcus galloyticus ssp pasteurianus  Culture, blood (routine x 2)  Status: None   Collection Time: 04/21/15  3:01 PM  Result Value Ref Range Status   Specimen Description BLOOD  Final   Special Requests Normal  Final   Culture  Setup Time   Final    BLOOD ANAEROBIC BOTTLE GRAM POSITIVE COCCI IN CHAINS CRITICAL RESULT CALLED TO, READ BACK BY AND VERIFIED WITH: JADIE COHEN AT 8315 04/22/15 DV    Culture   Final    STREPTOCOCCUS GALLOLYTICUS SSP PASTEURIANS ANAEROBIC BOTTLE ONLY REFER TO OTHER SET FOR ORGANISM SUSCEPTIBILITIES    Report Status May 10, 2015 FINAL  Final    RADIOLOGY:  No results found.  EKG:   Orders placed or performed during the hospital encounter of 04/20/15  . ED EKG  . ED EKG  . EKG 12-Lead  . EKG 12-Lead  . EKG      Management plans discussed with the patient, family and they are in agreement.  CODE STATUS:  Advance Directive Documentation        Most Recent Value   Type of Advance Directive  Out of facility DNR (pink MOST or yellow form)   Pre-existing out of facility DNR order (yellow form or pink MOST form)  Yellow form placed in chart (order not valid for inpatient use)   "MOST" Form in Place?        TOTAL TIME TAKING CARE OF THIS PATIENT: 50 minutes.    Myrtis Ser M.D on 05/02/2015 at 3:23 PM  Between 7am to 6pm - Pager - [phone]  After 6pm go to  [email or web address] - password [email or web address] [facility]  Office  [phone]  CC: Primary care physician; Philip Aschoff, MD

## 2015-05-13 DEATH — deceased

## 2016-12-18 IMAGING — CT CT HEAD W/O CM
2 series · 14 of 30 positions shown, 16 images · non-contrast
Comparison: Brain MR dated 09/18/2009.

CLINICAL DATA: The patient fell when walking from the bathroom to
the living room in his home and hit the back of his head on the
wall. No symptoms currently reported.

EXAM:
CT HEAD WITHOUT CONTRAST
TECHNIQUE: Contiguous axial images were obtained from the base of the skull
through the vertex without intravenous contrast.

[Series 2: head bone · axial · 0.41mm/px · z∈[-121,+17]mm · 8 of 87 slices shown]
[im 9/87  bone]
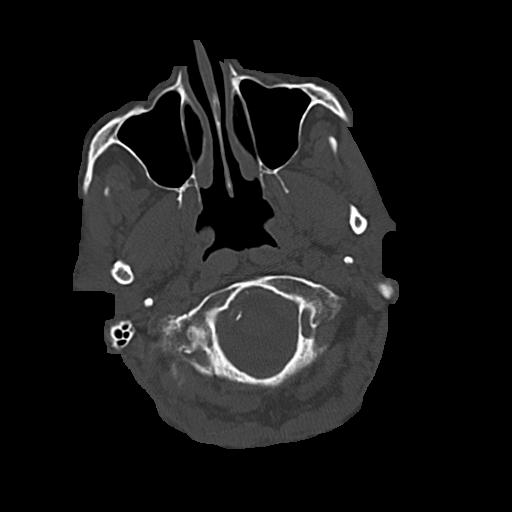
[im 17/87  bone]
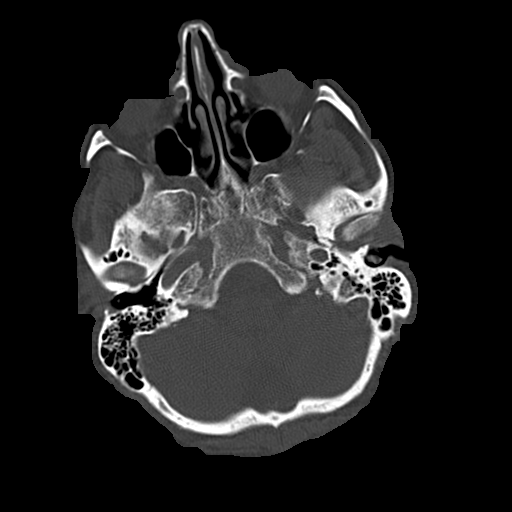
[im 29/87  bone]
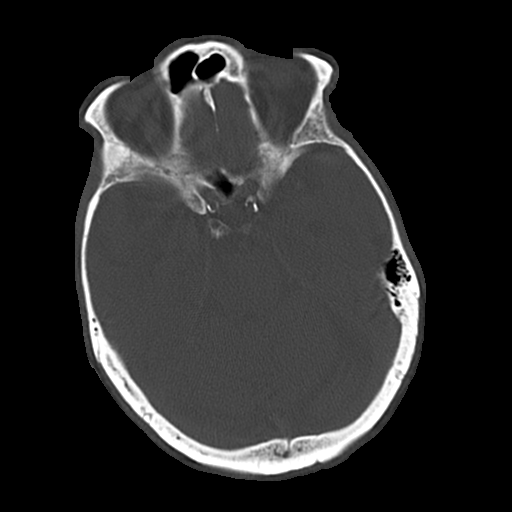
[im 37/87  bone]
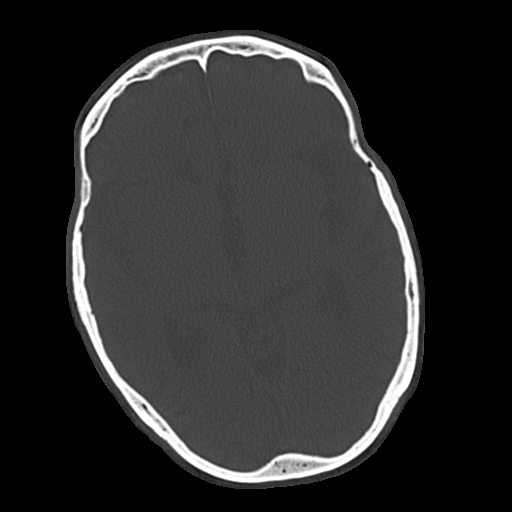
[im 50/87  bone]
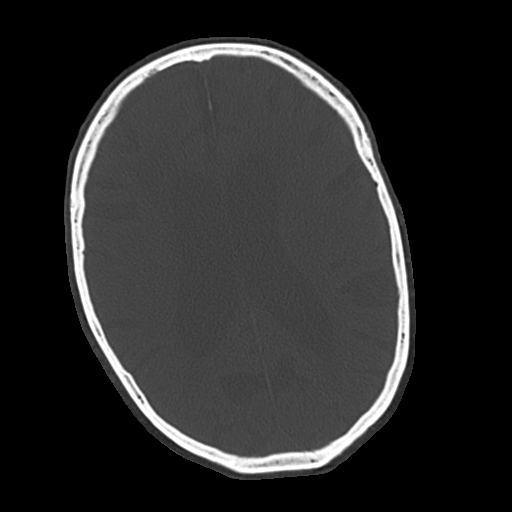
[im 58/87  bone]
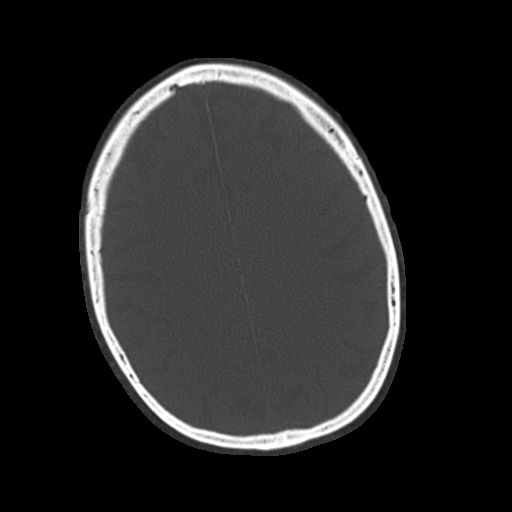
[im 70/87  bone]
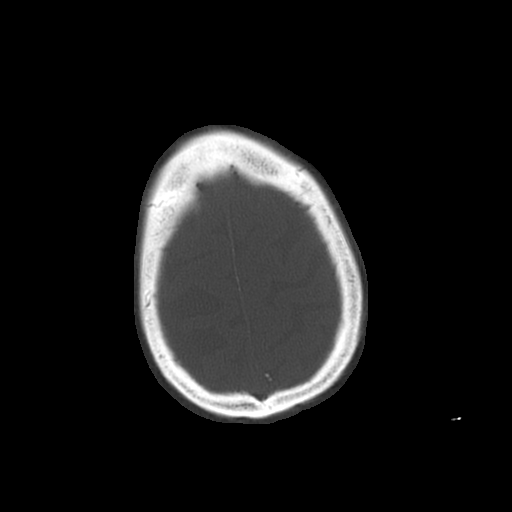
[im 78/87  bone]
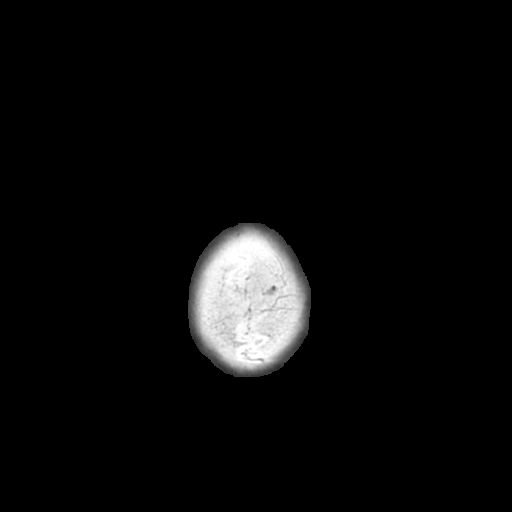

[Series 3: head wo · axial · 0.41mm/px · z∈[-108,-3]mm · 6 of 31 slices shown, 8 images]
[im 5/31  brain]
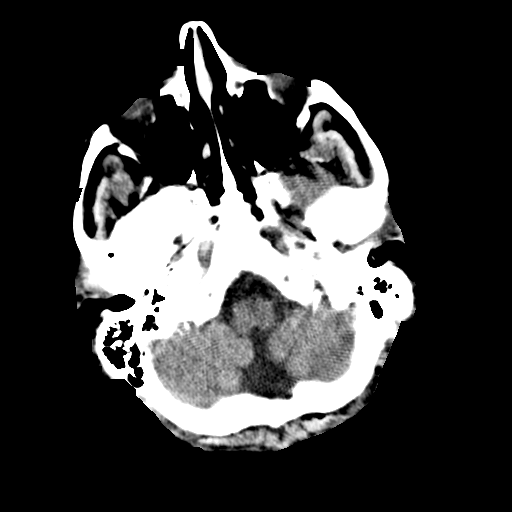
[im 5/31  bone]
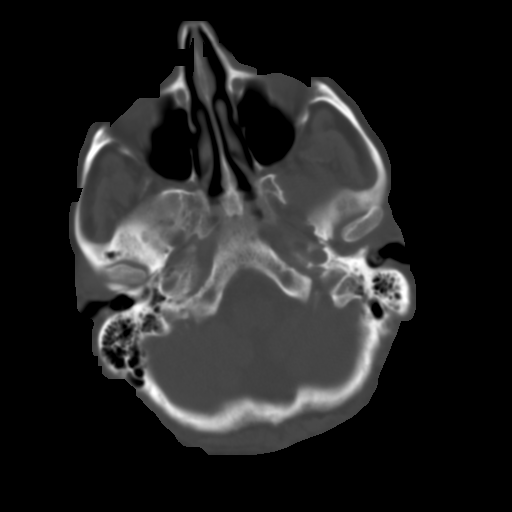
[im 9/31  brain]
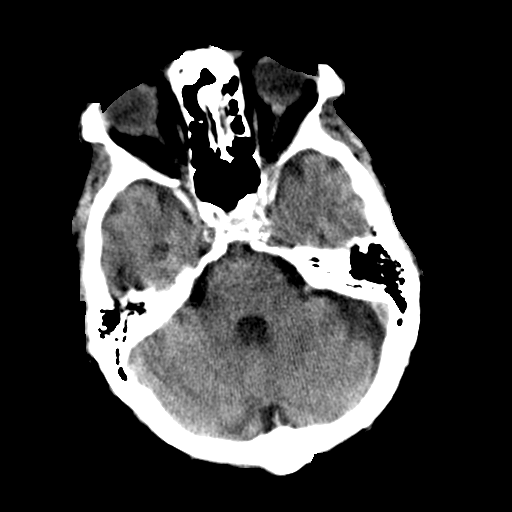
[im 13/31  brain]
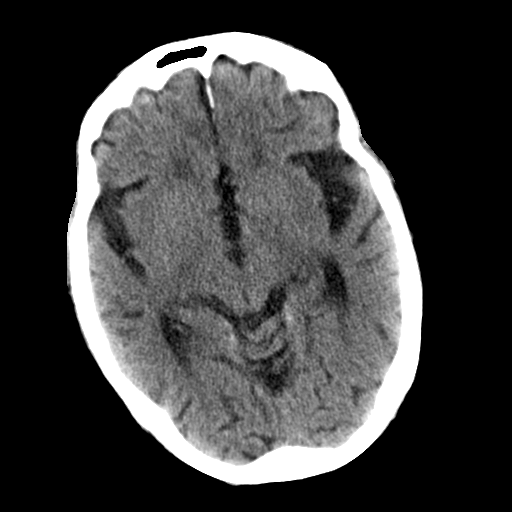
[im 18/31  brain]
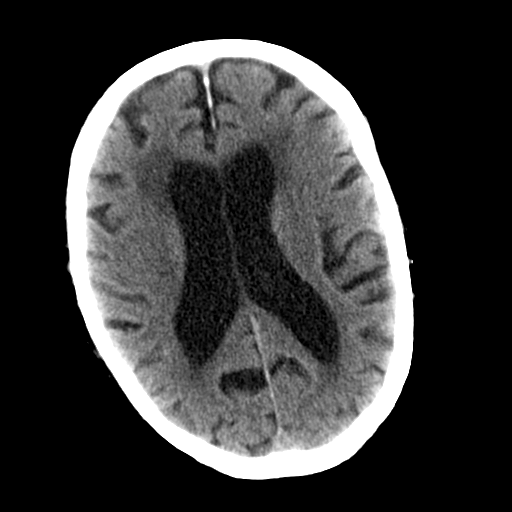
[im 22/31  brain]
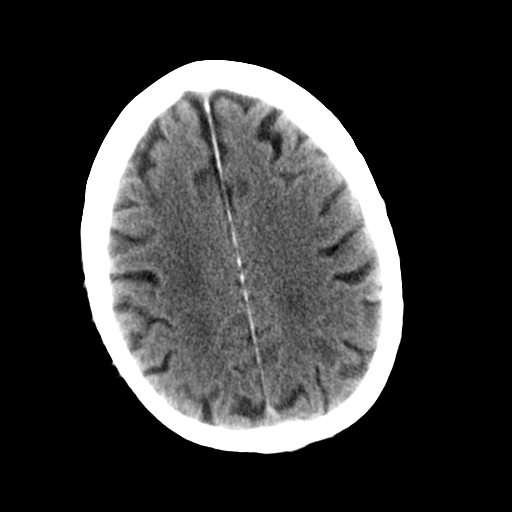
[im 22/31  bone]
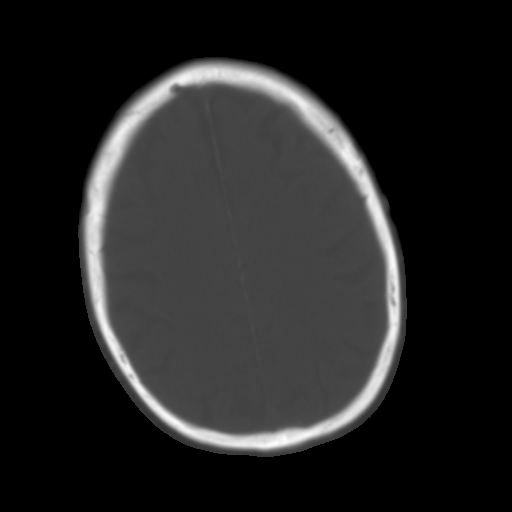
[im 26/31  brain]
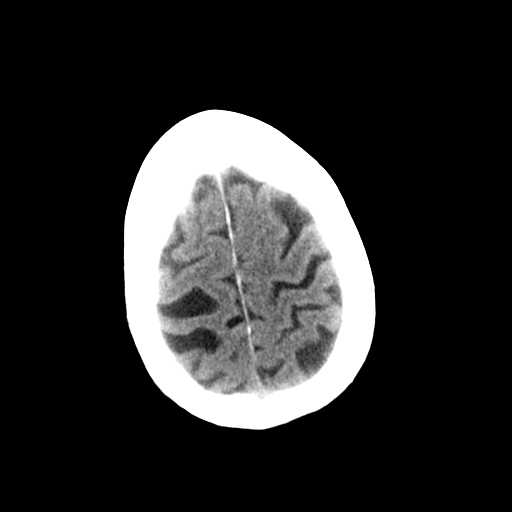

[14 of 30 positions shown; findings below may reference images not displayed]

FINDINGS: Diffusely enlarged ventricles and subarachnoid spaces. Patchy white
matter low density in both cerebral hemispheres. No skull fracture,
intracranial hemorrhage or paranasal sinus air-fluid levels.
IMPRESSION: 1. No skull fracture or intracranial hemorrhage.
2. Mildly progressive atrophy and chronic small vessel white matter
ischemic changes.

## 2016-12-18 IMAGING — CR DG CHEST 1V PORT
1 series · 1 of 1 positions shown · non-contrast
Comparison: 04/07/2014.

CLINICAL DATA: Weakness for several days.  Some coughing.

EXAM:
PORTABLE CHEST - 1 VIEW

[ap]
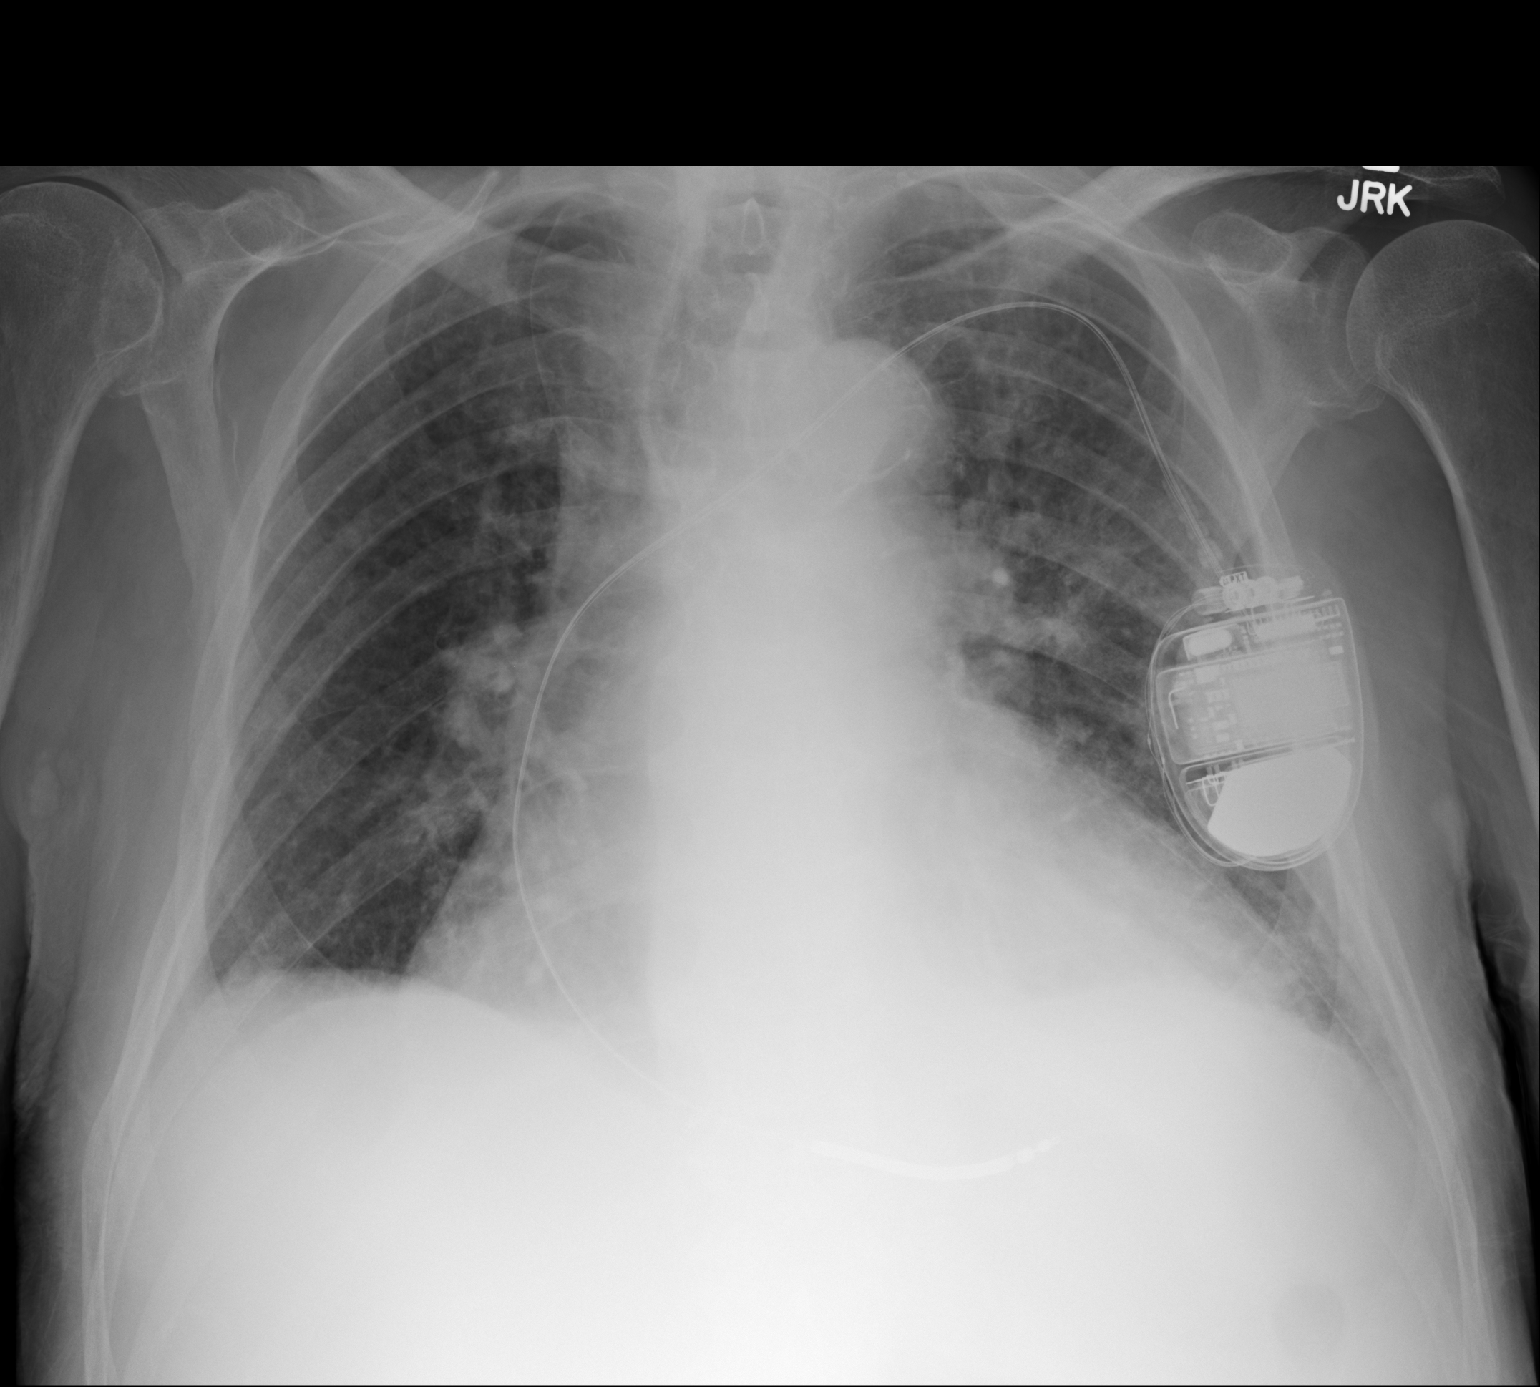

[1 of 1 positions shown; findings below may reference images not displayed]

FINDINGS: Mildly progressive enlargement of the cardiac silhouette. Breathing
motion blurring with no gross change in prominence of the pulmonary
vasculature and interstitial markings. No definite pleural fluid.
Stable left subclavian AICD lead.
IMPRESSION: Mildly progressive cardiomegaly with grossly stable pulmonary
vascular congestion and chronic interstitial lung disease.
